# Patient Record
Sex: Male | Born: 1950 | Race: White | Hispanic: No | Smoking: Former smoker
Health system: Southern US, Community
[De-identification: ages and names within clinical notes are randomized; demographics above are authoritative.]

## PROBLEM LIST (undated history)

## (undated) DIAGNOSIS — Z8489 Family history of other specified conditions: Secondary | ICD-10-CM

## (undated) DIAGNOSIS — K219 Gastro-esophageal reflux disease without esophagitis: Secondary | ICD-10-CM

## (undated) DIAGNOSIS — IMO0001 Reserved for inherently not codable concepts without codable children: Secondary | ICD-10-CM

## (undated) DIAGNOSIS — F419 Anxiety disorder, unspecified: Secondary | ICD-10-CM

## (undated) DIAGNOSIS — J449 Chronic obstructive pulmonary disease, unspecified: Secondary | ICD-10-CM

## (undated) HISTORY — PX: TONSILLECTOMY: SUR1361

---

## 1978-11-16 HISTORY — PX: KNEE SURGERY: SHX244

## 2005-11-26 ENCOUNTER — Ambulatory Visit: Payer: Self-pay | Admitting: Gastroenterology

## 2010-05-27 ENCOUNTER — Encounter: Payer: Self-pay | Admitting: Pulmonary Disease

## 2010-05-27 ENCOUNTER — Ambulatory Visit (HOSPITAL_COMMUNITY): Admission: RE | Admit: 2010-05-27 | Discharge: 2010-05-27 | Payer: Self-pay | Admitting: Family Medicine

## 2010-06-23 ENCOUNTER — Ambulatory Visit: Payer: Self-pay | Admitting: Pulmonary Disease

## 2010-06-23 DIAGNOSIS — J309 Allergic rhinitis, unspecified: Secondary | ICD-10-CM | POA: Insufficient documentation

## 2010-06-24 ENCOUNTER — Telehealth (INDEPENDENT_AMBULATORY_CARE_PROVIDER_SITE_OTHER): Payer: Self-pay | Admitting: *Deleted

## 2010-08-07 ENCOUNTER — Telehealth (INDEPENDENT_AMBULATORY_CARE_PROVIDER_SITE_OTHER): Payer: Self-pay | Admitting: *Deleted

## 2010-08-13 ENCOUNTER — Telehealth (INDEPENDENT_AMBULATORY_CARE_PROVIDER_SITE_OTHER): Payer: Self-pay | Admitting: *Deleted

## 2010-09-22 ENCOUNTER — Ambulatory Visit: Payer: Self-pay | Admitting: Pulmonary Disease

## 2010-12-16 NOTE — Progress Notes (Signed)
Summary: results  of cxr  Phone Note Call from Patient   Caller: Patient Call For: [facility] Summary of Call: Ppr requests results of cxr. cell [phone] Initial call taken by: Tivis Ringer, CNA,  June 24, 2010 4:05 PM  Follow-up for Phone Call        called and spoke with pt.  pt aware of cxr results.  Aundra Millet Reynolds LPN  June 24, 2010 4:07 PM

## 2010-12-16 NOTE — Assessment & Plan Note (Signed)
Summary: consult for emphysema   Copy to:  Antony Haste Primary Provider/Referring Provider:  Antony Haste  CC:  Pulmonary Consult.  History of Present Illness: The pt is a 60y/o male who is referred for evaluation of dyspnea.  He has a longstanding h/o doe for the last 2-3 years, but has not returned to his usual baseline after an episode of what sounds like acute bronchitis this spring.  The pt has been trying to stay active, and goes to the gym for on an elliptical 2-3 times a week.  He denies getting sob bringing groceries in from the car, or with one flight of stairs.  He denies any cough issues, and has not had wheezing.  He has no known h/o heart disease, and denies any LE edema.  He denies any early FHx of emphysema.  He does have a long h/o tobacco abuse, but quit 3 years ago.  He has not had a recent cxr, but did [facility].  These showed severe obstructive disease.  Preventive Screening-Counseling & Management  Alcohol-Tobacco     Smoking Status: quit  Current Medications (verified): 1)  Gemfibrozil 600 Mg Tabs (Gemfibrozil) .... Take 1 Tablet By Mouth Once A Day 2)  Fish Oil Triple Strength 1400 Mg Caps (Omega-3 Fatty Acids) .... Take 1 Tablet By Mouth Once A Day 3)  Vitamin D 1000 Unit Tabs (Cholecalciferol) .... Take 1 Tablet By Mouth Once A Day 4)  Tylenol or Tylenol Extra Strength or Tylenol Pm .... Use As Needed  Allergies (verified): 1)  ! Penicillin  Past History:  Past Medical History: Current Problems:  ALLERGIC RHINITIS (ICD-477.9)    Past Surgical History: R knee surgery 1970s tonsillectomy as a child  Family History: Reviewed history and no changes required. emphysema: father allergies: mother cancer: father (lung), mother (colon)   Social History: Reviewed history and no changes required. Patient states former smoker.  started at age 52.  2 ppd.  quit August 2008. pt is separated. pt has children. pt lives with  his parents. pt works as a Veterinary surgeon.   Smoking Status:  quit  Review of Systems       The patient complains of shortness of breath with activity.  The patient denies shortness of breath at rest, productive cough, non-productive cough, coughing up blood, chest pain, irregular heartbeats, acid heartburn, indigestion, loss of appetite, weight change, abdominal pain, difficulty swallowing, sore throat, tooth/dental problems, headaches, nasal congestion/difficulty breathing through nose, sneezing, itching, ear ache, anxiety, depression, hand/feet swelling, joint stiffness or pain, rash, change in color of mucus, and fever.    Vital Signs:  Patient profile:   60 year old male Height:      75 inches Weight:      180 pounds BMI:     22.58 O2 Sat:      95 % on Room air Temp:     98.8 degrees F oral Pulse rate:   76 / minute BP sitting:   152 / 84  (right arm) Cuff size:   regular  Vitals Entered By: Arman Filter LPN (June 23, 2010 2:38 PM)  O2 Flow:  Room air CC: Pulmonary Consult Comments Medications reviewed with patient Arman Filter LPN  June 23, 2010 2:38 PM    Physical Exam  General:  thin male in nad Eyes:  PERRLA and EOMI.   Nose:  patent without discharge Mouth:  clear  Neck:  no jvd, tmg, LN Lungs:  clear  to auscultation Heart:  rrr, no mrg Abdomen:  soft and nontender, bs+ Extremities:  no edema or cyanosis noted pulses intact distally Neurologic:  alert and oriented, moves all 4.   Impression & Recommendations:  Problem # 1:  EMPHYSEMA (ICD-492.8) the pt has moderate to severe airflow obstruction by recent spirometry that is most c/w emphysema.  I cannot totally exclude a component of asthma, but his history is not supportive of this.  His functional status is much better than his degree of airflow obstruction, but he does not have day to day variability.  I would like to start him on a bronchodilator regimen, and will begin with longacting anticholinergic.   Will step up therapy from there if not happy with his progress.  Will also check cxr and A!AT level today as well for completeness.  He is to followup with me in 3mos, but call if not seeing improvement with spiriva.  I have stressed the importance of continuing on his exercise program.  Medications Added to Medication List This Visit: 1)  Gemfibrozil 600 Mg Tabs (Gemfibrozil) .... Take 1 tablet by mouth once a day 2)  Fish Oil Triple Strength 1400 Mg Caps (Omega-3 fatty acids) .... Take 1 tablet by mouth once a day 3)  Vitamin D 1000 Unit Tabs (Cholecalciferol) .... Take 1 tablet by mouth once a day 4)  Tylenol or Tylenol Extra Strength or Tylenol Pm  .... Use as needed 5)  Spiriva Handihaler 18 Mcg Caps (Tiotropium bromide monohydrate) .... One puff in handihaler daily 6)  Proair Hfa 108 (90 Base) Mcg/act Aers (Albuterol sulfate) .... 2 puffs every 4-6 hours as needed  Other Orders: Consultation Level IV (16109) T- * Misc. Laboratory test [phone]) T-2 View CXR (71020TC)  Patient Instructions: 1)  will start on spiriva one inhalation each am 2)  can use proventil (albuterol) 2 inhalations up to every 4 hrs for rescue. 3)  continue on exercise program 4)  will check cxr today, and will also check blood test for hereditary emphysema. 5)  followup with me in 3mos, but call if you are not satisfied with your response to spiriva.   Prescriptions: PROAIR HFA 108 (90 BASE) MCG/ACT  AERS (ALBUTEROL SULFATE) 2 puffs every 4-6 hours as needed  #1 x 6   Entered and Authorized by:   Barbaraann Share MD   Signed by:   Barbaraann Share MD on 06/23/2010   Method used:   Print then Give to Patient   RxID:   [id number] SPIRIVA HANDIHALER 18 MCG  CAPS (TIOTROPIUM BROMIDE MONOHYDRATE) one puff in handihaler daily  #30 x 6   Entered and Authorized by:   Barbaraann Share MD   Signed by:   Barbaraann Share MD on 06/23/2010   Method used:   Print then Give to Patient   RxID:   [id number]

## 2010-12-16 NOTE — Assessment & Plan Note (Signed)
Summary: rov for emphysema   Visit Type:  Follow-up Copy to:  Antony Haste Primary Provider/Referring Provider:  Antony Haste  CC:  3 month follow up. pt states breathing is doing well. Pt denies any cough. pt states xopenex caused him to have throat irritation and high voice. pt prefers to use proair over proventile. pt quit smoking 2009. Marland Kitchen  History of Present Illness: the pt comes in today for f/u of his known emphysema.  He is doing well with spiriva and as needed albuterol, and has been staying active.  He denies any recent pulmonary infection or acute exacerbation.  He denies any chest congestion, cough, or purulence.  Current Medications (verified): 1)  Gemfibrozil 600 Mg Tabs (Gemfibrozil) .... Take 1 Tablet By Mouth Once A Day 2)  Fish Oil Triple Strength 1400 Mg Caps (Omega-3 Fatty Acids) .... Take 1 Tablet By Mouth Once A Day 3)  Vitamin D 1000 Unit Tabs (Cholecalciferol) .... Take 1 Tablet By Mouth Once A Day 4)  Tylenol or Tylenol Extra Strength or Tylenol Pm .... Use As Needed 5)  Spiriva Handihaler 18 Mcg  Caps (Tiotropium Bromide Monohydrate) .... One Puff in Handihaler Daily 6)  Proventil Hfa 108 (90 Base) Mcg/act Aers (Albuterol Sulfate) .Marland Kitchen.. 1-2 Puffs Every 4-6 Hrs As Needed  Allergies (verified): 1)  ! Penicillin  Review of Systems       The patient complains of shortness of breath with activity.  The patient denies shortness of breath at rest, productive cough, non-productive cough, coughing up blood, chest pain, irregular heartbeats, acid heartburn, indigestion, loss of appetite, weight change, abdominal pain, difficulty swallowing, sore throat, tooth/dental problems, headaches, nasal congestion/difficulty breathing through nose, sneezing, itching, ear ache, anxiety, depression, hand/feet swelling, joint stiffness or pain, rash, change in color of mucus, and fever.    Vital Signs:  Patient profile:   60 year old male Height:      75 inches Weight:      183.50  pounds O2 Sat:      98 % on Room air Temp:     98.2 degrees F oral Pulse rate:   69 / minute BP sitting:   124 / 82  (left arm) Cuff size:   regular  Vitals Entered By: Carver Fila (September 22, 2010 1:38 PM)  O2 Flow:  Room air CC: 3 month follow up. pt states breathing is doing well. Pt denies any cough. pt states xopenex caused him to have throat irritation and high voice. pt prefers to use proair over proventile. pt quit smoking 2009.  Comments meds and allergies updated Phone number updated Carver Fila  September 22, 2010 1:38 PM    Physical Exam  General:  thin male in nad Lungs:  totally clear to auscultation Heart:  rrr, no mrg Extremities:  no edema or cyanosis  Neurologic:  alert and oriented, moves all 4.   Impression & Recommendations:  Problem # 1:  EMPHYSEMA (ICD-492.8) the pt is doing well with spiriva and as needed SABA, which he hardly uses.  He is staying active, and is satisfied with his exertional tolerance.  I have asked him to stay on his current meds, and to f/u in 6mos.  Medications Added to Medication List This Visit: 1)  Proventil Hfa 108 (90 Base) Mcg/act Aers (Albuterol sulfate) .Marland Kitchen.. 1-2 puffs every 4-6 hrs as needed 2)  Proair Hfa 108 (90 Base) Mcg/act Aers (Albuterol sulfate) .... 2 puffs every 4-6 hours as needed  Patient Instructions: 1)  will send in [facility]air to [facility] 2)  continue to stay active 3)  followup with me in 6mos.  Prescriptions: PROAIR HFA 108 (90 BASE) MCG/ACT  AERS (ALBUTEROL SULFATE) 2 puffs every 4-6 hours as needed  #3 x 4   Entered and Authorized by:   Barbaraann Share MD   Signed by:   Barbaraann Share MD on 09/22/2010   Method used:   Faxed to ...       [facility] (mail-order)             , [place]         Ph: [phone]       Fax: [phone]   RxID:   [id number]     Immunization History:  Influenza Immunization History:    Influenza:  historical (08/17/2010)  Pneumovax Immunization History:    Pneumovax:   historical (08/17/2010)

## 2010-12-16 NOTE — Progress Notes (Signed)
Summary: question about spiriva refill to [facility]  Phone Note Call from Patient Call back at Work Phone [phone] Call back at [phone]   Caller: Patient Call For: clance Summary of Call: pt needs to switch rx over to [facility] this is on his sprivia [phone]  Initial call taken by: Lacinda Axon,  August 13, 2010 12:24 PM  Follow-up for Phone Call        called and spoke with pt.  pt states he still hasn't gotten meds from [facility] that we faxed on Friday 08/08/2010 and states he is now about to run out.  Informed pt I would leave 1 box of Spiriva samples at the front desk for him to take until his shipment arrived.  Called [facility] and verified that they did indeed receive our fax on 08/08/2010 for rx for Spiriva and med was shipped out  to pt yesterday so he should be receiving it soon.  Informed pt of the above info. nothing further needed.   Aundra Millet Reynolds LPN  August 13, 2010 2:19 PM

## 2010-12-16 NOTE — Progress Notes (Signed)
Summary: proair changed to xopenex mdi  Phone Note Call from Patient Call back at [phone]   Caller: Patient Call For: clance Reason for Call: Refill Medication Summary of Call: Wants generic proair hfa 108//[facility] Initial call taken by: Darletta Moll,  August 07, 2010 4:28 PM  Follow-up for Phone Call        Spoke with pt.  He states he was told by [facility] that levalbuterol spray/inhalation would be cheaper for him instead of proair.  Would like to know if this med would work for him.  Dr. Shelle Iron, pls advise.  Thanks!  Follow-up by: Gweneth Dimitri RN,  August 07, 2010 5:01 PM  Additional Follow-up for Phone Call Additional follow up Details #1::        that would surprise me if that were the case. but ok to call in xopenex mdi 2 puffs every 6hrs as needed for rescue.  #1, 69fills Additional Follow-up by: Barbaraann Share MD,  August 07, 2010 5:03 PM    Additional Follow-up for Phone Call Additional follow up Details #2::    Called, spoke with pt.  He is aware Dr. Shelle Iron is ok with proair being changed to xopenex 2 puffs every 6 hours as needed for rescue.  He verbalized understanding and is aware rx sent to [facility].   Because this is being sent to [facility], will send rx as #3 with 1 additional fill.   Follow-up by: Gweneth Dimitri RN,  August 07, 2010 5:11 PM  New/Updated Medications: XOPENEX HFA 45 MCG/ACT AERO (LEVALBUTEROL TARTRATE) 2 puffs every 6 hours as needed for rescue Prescriptions: XOPENEX HFA 45 MCG/ACT AERO (LEVALBUTEROL TARTRATE) 2 puffs every 6 hours as needed for rescue  #3 x 1   Entered by:   Gweneth Dimitri RN   Authorized by:   Barbaraann Share MD   Signed by:   Gweneth Dimitri RN on 08/07/2010   Method used:   Faxed to ...       [facility] (mail-order)             , [place]         Ph: [phone]       Fax: [phone]   RxID:   [id number]

## 2011-03-19 ENCOUNTER — Encounter: Payer: Self-pay | Admitting: Pulmonary Disease

## 2011-03-23 ENCOUNTER — Ambulatory Visit (INDEPENDENT_AMBULATORY_CARE_PROVIDER_SITE_OTHER): Payer: BC Managed Care – PPO | Admitting: Pulmonary Disease

## 2011-03-23 ENCOUNTER — Encounter: Payer: Self-pay | Admitting: Pulmonary Disease

## 2011-03-23 VITALS — BP 140/76 | HR 69 | Temp 98.2°F | Ht 75.0 in | Wt 181.6 lb

## 2011-03-23 DIAGNOSIS — J438 Other emphysema: Secondary | ICD-10-CM

## 2011-03-23 NOTE — Patient Instructions (Signed)
No change in meds for now.  Can consider a long acting "proair" if you feels symptoms are not adequately controlled.  Work on Product manager followup with me in 6mos

## 2011-03-23 NOTE — Progress Notes (Signed)
  Subjective:    Patient ID: Dale Woods, male    DOB: [birth year], 60 y.o.   MRN: [id number]  HPI The pt comes in today for f/u of his known emphysema.  He has been staying on spiriva with prn albuterol, and feels he is doing well.  He is staying active, and although he does have doe, he feels he is remaining very functional.  He has not had an acute exacerbation or recent pulmonary infection, and uses his rescue inhaler about twice a week.    Review of Systems  Constitutional: Negative for fever and unexpected weight change.  HENT: Positive for congestion and rhinorrhea. Negative for ear pain, nosebleeds, sore throat, sneezing, trouble swallowing, dental problem, postnasal drip and sinus pressure.   Eyes: Negative for redness and itching.  Respiratory: Positive for shortness of breath and wheezing. Negative for cough and chest tightness.   Cardiovascular: Negative for palpitations and leg swelling.  Gastrointestinal: Negative for nausea and vomiting.  Genitourinary: Negative for dysuria.  Musculoskeletal: Negative for joint swelling.  Skin: Negative for rash.  Neurological: Positive for headaches.  Hematological: Does not bruise/bleed easily.  Psychiatric/Behavioral: Negative for dysphoric mood. The patient is not nervous/anxious.        Objective:   Physical Exam Wd male in nad Chest totally clear to auscultation, no wheezing Cor with rrr No LE edema, no cyanosis Alert, oriented, moves all 4        Assessment & Plan:

## 2011-03-23 NOTE — Assessment & Plan Note (Signed)
The pt is stable from a pulmonary standpoint.  He is staying active, and has not had any worsening symptoms.  I discussed with him today the possibility of adding a LABA to his regimen, but he would like to hold off for now.  He will f/u with me in 6mos

## 2011-06-09 ENCOUNTER — Other Ambulatory Visit: Payer: Self-pay | Admitting: Pulmonary Disease

## 2011-09-23 ENCOUNTER — Encounter: Payer: Self-pay | Admitting: Pulmonary Disease

## 2011-09-23 ENCOUNTER — Ambulatory Visit (INDEPENDENT_AMBULATORY_CARE_PROVIDER_SITE_OTHER): Payer: BC Managed Care – PPO | Admitting: Pulmonary Disease

## 2011-09-23 VITALS — BP 122/72 | HR 72 | Temp 98.6°F | Ht 75.0 in | Wt 181.8 lb

## 2011-09-23 DIAGNOSIS — J438 Other emphysema: Secondary | ICD-10-CM

## 2011-09-23 DIAGNOSIS — Z23 Encounter for immunization: Secondary | ICD-10-CM

## 2011-09-23 NOTE — Assessment & Plan Note (Signed)
The patient is doing very well on his current medical regimen.  He is trying to exercise as much as possible, and he has not had any recent pulmonary infection or acute exacerbation.  I have asked him to stay on his current bronchodilator, but if he does begin to have issues would consider adding LABA.  We'll give the patient a flu shot today, and he is to followup with me in one year.

## 2011-09-23 NOTE — Progress Notes (Signed)
  Subjective:    Patient ID: Dale Woods, male    DOB: 1951-05-22, 60 y.o.   MRN: [id number]  HPI The patient comes in today for followup of his known COPD.  He is maintaining on his bronchodilator regimen, and continues to have excellent exercise tolerance.  He has not had an acute exacerbation since his last visit, and is not over using his rescue inhaler.  He has not had his flu shot yet this year.   Review of Systems  Constitutional: Negative for fever and unexpected weight change.  HENT: Positive for congestion and rhinorrhea. Negative for ear pain, nosebleeds, sore throat, sneezing, trouble swallowing, dental problem, postnasal drip and sinus pressure.   Eyes: Negative for redness and itching.  Respiratory: Positive for wheezing. Negative for cough, chest tightness and shortness of breath.   Cardiovascular: Negative for palpitations and leg swelling.  Gastrointestinal: Negative for nausea and vomiting.  Genitourinary: Negative for dysuria.  Musculoskeletal: Negative for joint swelling.  Skin: Negative for rash.  Neurological: Negative for headaches.  Hematological: Does not bruise/bleed easily.  Psychiatric/Behavioral: Negative for dysphoric mood. The patient is not nervous/anxious.        Objective:   Physical Exam Thin male in no acute distress Nose without purulence or discharge noted Chest totally clear to auscultation, no wheezes or rhonchi Cardiac exam with regular rate and rhythm Lower extremities without edema, no cyanosis noted Alert and oriented, moves all 4 extremities.       Assessment & Plan:

## 2011-09-23 NOTE — Patient Instructions (Signed)
No change in medications Will give you the flu shot today followup with me in one year since you are doing so well, but call if having issues.

## 2011-09-23 NOTE — Progress Notes (Signed)
Addended by: Ozella Almond R on: 09/23/2011 02:14 PM   Modules accepted: Orders

## 2012-05-10 ENCOUNTER — Other Ambulatory Visit: Payer: Self-pay | Admitting: Neurosurgery

## 2012-05-20 ENCOUNTER — Telehealth: Payer: Self-pay | Admitting: Pulmonary Disease

## 2012-05-20 NOTE — Telephone Encounter (Signed)
Called, spoke with pt.  I informed him of below per Dr. Shelle Iron.  He verbalized understanding of this.  [facility] does not have an opening until Aug that I see that we can use.  He has a 4pm held slot on Aug 5 but pt would like to be seen prior to this if possible as preop is scheduled on Aug 7.  Pls advise if he can be worked in in July.  Thank you.

## 2012-05-20 NOTE — Telephone Encounter (Signed)
Because of the severity of his copd, he needs an ov prior to his surgery so I can check on him.

## 2012-05-20 NOTE — Telephone Encounter (Signed)
I spoke with pt and he states he is scheduled to have back surgery on 06/30/12 by Dr. Venetia Maxon over at [facility]. His pre-op apt is scheduled for 06/22/12. He was not told yet if he needed surgical clearance from [facility] yet or not but he wanted to get a jump on things just in case. Pt last seen 09/23/11 by [facility]. Please advise Dr. Shelle Iron, thanks

## 2012-05-20 NOTE — Telephone Encounter (Signed)
Pt can be seen on 06/02/12 @ 2:30pm.

## 2012-05-23 NOTE — Telephone Encounter (Signed)
Called spoke with patient, advised of work-in appt.  Pt okay with this date and time.  appt scheduled.  Nothing further needed at this time, will sign off.

## 2012-06-02 ENCOUNTER — Ambulatory Visit (INDEPENDENT_AMBULATORY_CARE_PROVIDER_SITE_OTHER): Payer: BC Managed Care – PPO | Admitting: Pulmonary Disease

## 2012-06-02 ENCOUNTER — Encounter: Payer: Self-pay | Admitting: Pulmonary Disease

## 2012-06-02 VITALS — BP 124/82 | HR 78 | Temp 98.6°F | Ht 74.0 in | Wt 185.6 lb

## 2012-06-02 DIAGNOSIS — J438 Other emphysema: Secondary | ICD-10-CM

## 2012-06-02 NOTE — Progress Notes (Signed)
  Subjective:    Patient ID: Dale Woods, male    DOB: 01-20-1951, 61 y.o.   MRN: [id number]  HPI The patient comes in today for followup of his known COPD, and also preop pulmonary clearance for his upcoming back surgery.  He has had no issues since his last visit, and feels that his breathing is very stable his current medication.  He is very active, and has not had an acute exacerbation of her pulmonary infection since last visit.  He feels that he is at his normal baseline.   Review of Systems  Constitutional: Positive for fatigue. Negative for fever and unexpected weight change.  HENT: Negative for ear pain, nosebleeds, congestion, sore throat, rhinorrhea, sneezing, trouble swallowing, dental problem, postnasal drip and sinus pressure.   Eyes: Negative for redness and itching.  Respiratory: Positive for shortness of breath. Negative for cough, chest tightness and wheezing.   Cardiovascular: Negative for palpitations and leg swelling.  Gastrointestinal: Negative for nausea and vomiting.  Genitourinary: Negative for dysuria.  Musculoskeletal: Positive for back pain. Negative for joint swelling.  Skin: Negative for rash.  Neurological: Negative for headaches.  Hematological: Does not bruise/bleed easily.  Psychiatric/Behavioral: Negative for dysphoric mood. The patient is not nervous/anxious.   All other systems reviewed and are negative.       Objective:   Physical Exam Thin male in no acute distress Nose without purulence or discharge noted Chest totally clear to auscultation, no wheezing Cardiac exam is regular rate and rhythm Lower extremities without edema, cyanosis Alert and oriented, moves all 4 extremities.       Assessment & Plan:

## 2012-06-02 NOTE — Assessment & Plan Note (Signed)
The patient is doing very well from a COPD standpoint, with excellent exertional tolerance and no recent complications.  I think he will do very well from a pulmonary standpoint with his upcoming back surgery, and feel that he is at low risk for postop pulmonary complications.  I would recommend early ambulation when allowed by neurosurgery, as well as early incentive spirometry.  Otherwise, I would continue his usual home pulmonary medications while in the hospital.

## 2012-06-02 NOTE — Patient Instructions (Addendum)
Continue with current medications Use incentive spirometry frequently after surgery to help keep lungs expanded. Cancel followup visit in Nov, and schedule followup with me in one year.

## 2012-06-11 ENCOUNTER — Other Ambulatory Visit: Payer: Self-pay | Admitting: Pulmonary Disease

## 2012-06-15 ENCOUNTER — Encounter (HOSPITAL_COMMUNITY): Payer: Self-pay | Admitting: Pharmacy Technician

## 2012-06-22 ENCOUNTER — Encounter (HOSPITAL_COMMUNITY): Payer: Self-pay

## 2012-06-22 ENCOUNTER — Encounter (HOSPITAL_COMMUNITY)
Admission: RE | Admit: 2012-06-22 | Discharge: 2012-06-22 | Disposition: A | Discharge status: Home / Self Care | Payer: BC Managed Care – PPO | Source: Ambulatory Visit | Attending: Neurosurgery | Admitting: Neurosurgery

## 2012-06-22 HISTORY — DX: Gastro-esophageal reflux disease without esophagitis: K21.9

## 2012-06-22 HISTORY — DX: Chronic obstructive pulmonary disease, unspecified: J44.9

## 2012-06-22 LAB — BASIC METABOLIC PANEL
CO2: 27 mEq/L (ref 19–32)
Calcium: 9.4 mg/dL (ref 8.4–10.5)
Creatinine, Ser: 1.19 mg/dL (ref 0.50–1.35)
GFR calc Af Amer: 76 mL/min — ABNORMAL LOW (ref >90)
Sodium: 138 mEq/L (ref 135–145)

## 2012-06-22 LAB — CBC
MCH: 31.1 pg (ref 26.0–34.0)
Platelets: 256 10*3/uL (ref 150–400)
RBC: 4.76 MIL/uL (ref 4.22–5.81)

## 2012-06-22 LAB — SURGICAL PCR SCREEN
MRSA, PCR: NEGATIVE
Staphylococcus aureus: NEGATIVE

## 2012-06-22 NOTE — Pre-Procedure Instructions (Signed)
20 JEDI CATALFAMO  06/22/2012   Your procedure is scheduled on:  06/28/12  Report to [facility] at 530 AM.  Call this number if you have problems the morning of surgery: [phone]   Remember:   Do not eat food:After Midnight.  May have clear liquids:until Midnight .  Clear liquids include soda, tea, black coffee, apple or grape juice, broth.  Take these medicines the morning of surgery with A SIP OF WATER: *inhalers   Do not wear jewelry, make-up or nail polish.  Do not wear lotions, powders, or perfumes. You may wear deodorant.  Do not shave 48 hours prior to surgery. Men may shave face and neck.  Do not bring valuables to the hospital.  Contacts, dentures or bridgework may not be worn into surgery.  Leave suitcase in the car. After surgery it may be brought to your room.  For patients admitted to the hospital, checkout time is 11:00 AM the day of discharge.   Patients discharged the day of surgery will not be allowed to drive home.  Name and phone number of your driver: family  Special Instructions: CHG Shower Use Special Wash: 1/2 bottle night before surgery and 1/2 bottle morning of surgery.   Please read over the following fact sheets that you were given: Pain Booklet, Coughing and Deep Breathing, Blood Transfusion Information, MRSA Information and Surgical Site Infection Prevention

## 2012-06-22 NOTE — Progress Notes (Signed)
ok'ed for surgery by dr clance. Note in epic

## 2012-06-27 MED ORDER — VANCOMYCIN HCL IN DEXTROSE 1-5 GM/200ML-% IV SOLN
1000.0000 mg | INTRAVENOUS | Status: AC
  Administered 2012-06-28: 1000 mg via INTRAVENOUS
  Filled 2012-06-27: qty 200

## 2012-06-27 NOTE — H&P (Signed)
[name]  #[id number]  DOB:  Sep 20, 1951 05/09/2012:  [name] comes back today.  He had an injection.  He did not get any significant relief with that injection and said it helped only one week.  He says his pain is worse.  Both his legs are going numb.  He has significant pain in his left buttock and low back tightness and tired "all of the time".  He has been taking Naproxen twice daily.  He is not getting a lot of relief.    I went over his studies with him and his fiancee and we reviewed his imaging both radiographs and MRI.  These show significant scoliosis and spondylosis with stenosis at L3-4 on the right and L4-5 on the left.    I have recommended an anterolateral decompression and fusion L3-4 and L4-5 levels from a right-sided approach with percutaneous pedicle screw fixation.  I fitted him for an LSO brace.  He wants to go ahead with surgery early to mid August.  We went over details of the surgery.  Dale Woods also did patient education, fitted him for an LSO brace, answered their questions, reviewed models and imaging and discussed risks and benefits and he wishes to proceed with surgery.          Dale Woods. 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  He complains of numbness in both of his thighs to his feet.  He says this has been increasing since 12/2010.  The numbness has progressed since 01/2012.  He was taking Aleve, but stopped it waiting in preparation for an injection.  He had an injection on 03/11/2012 on the left at L4-5 which he said helped for about 3 days.  He has another injection scheduled for this Friday and is hoping to temporize his discomfort as his daughter is getting married very soon.  He describes it as an uncomfortable nagging pain for the last 2 to 3  months.  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  He did physical therapy and they did not think there was much they could teach him.  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    REVIEW OF SYSTEMS:   Review of Systems was reviewed with the patient.  Pertinent positives include under Eyes - he wears glasses; Ear, Nose, Mouth and Throat - he notes ringing in his ears; Cardiovascular - he notes elevated cholesterol; Respiratory - he notes emphysema and shortness of breath; Musculoskeletal - he notes back pain and leg pain.  He does note a history of smoking.    PAST MEDICAL HISTORY:      Medications and Allergies:  He takes Spiriva daily, Gemfibrozil 600 mg daily, Vitamin D and Fish Oil.  HE IS ALLERGIC TO PENICILLIN WHICH CAUSES ANAPHYLACTIC SHOCK.      Height and Weight:  He is 6', 2" tall and 178 lbs. with a BMI of 22.9.    SOCIAL HISTORY:    He denies tobacco, alcohol or drug use.  He stopped smoking 5 years ago.    DIAGNOSTIC STUDIES:   I reviewed an MRI of the lumbar spine which demonstrates a disc protrusion at L4-5 on  the left and central stenosis greater to the right at the L3-4 level.  MRI was performed through [facility].    PHYSICAL EXAMINATION:      General Appearance:  On examination today, [name] is a pleasant, cooperative man in no acute distress.      Blood Pressure, Pulse and Respiratory Rate:  His blood pressure is 132/82.  Heart rate is 70 and regular.  Respiratory rate is 16.      HEENT - normocephalic, atraumatic.  The pupils are equal, round and reactive to light.  The extraocular muscles are intact.  Sclerae - white.  Conjunctiva - pink.  Oropharynx benign.  Uvula midline.     Neck - there are no masses, meningismus, deformities, tracheal deviation, jugular vein distention or carotid bruits.  There is normal cervical range of motion.  Spurlings' test is negative  without reproducible radicular pain turning the patient's head to either side.  Lhermitte's sign is not present with axial compression.      Respiratory - there is normal respiratory effort with good intercostal function.  Lungs are clear to auscultation.  There are no rales, rhonchi or wheezes.      Cardiovascular - the heart has regular rate and rhythm to auscultation.  No murmurs are appreciated.  There is no extremity edema, cyanosis or clubbing.  There are palpable pedal pulses.      Abdomen - soft, nontender, no hepatosplenomegaly appreciated or masses.  There are active bowel sounds.  No guarding or rebound.      Musculoskeletal Examination - he complains of pain at the lumbosacral junction.  He is able to stand on his heels and toes.  He has a positive straight leg raise on the left at 45 degrees.    NEUROLOGICAL EXAMINATION: The patient is oriented to time, person and place and has good recall of both recent and remote memory with normal attention span and concentration.  The patient speaks with clear and fluent speech and exhibits normal language function and appropriate fund of knowledge.      Cranial Nerve Examination - pupils are equal, round and reactive to light.  Extraocular movements are full.  Visual fields are full to confrontational testing.  Facial sensation and facial movement are symmetric and intact.  Hearing is intact to finger rub.  Palate is upgoing.  Shoulder shrug is symmetric.  Tongue protrudes in the midline.    Motor Examination - motor strength is 5/5 in the bilateral deltoids, biceps, triceps, handgrips, wrist extensors, interosseous.  In the lower extremities motor strength is 5/5 with the exception of 4+/5 left EHL strength and 4+/5 left hip abductor strength.      Sensory Examination - he notes decreased pin sensation in a left L5 and S1 distribution.      Deep Tendon Reflexes - 2 in the biceps, triceps and brachioradialis, 2 at the knees, 2 at the ankles and  great toes are downgoing to plantar stimulation.      Cerebellar Examination - normal coordination in upper and lower extremities and normal rapid alternating movements.  Romberg test is negative.    IMPRESSION AND RECOMMENDATIONS:   Dale Woods is a 61 year old man with lumbar stenosis and spondylosis causing significant stenosis at L3-4 and left-sided disc protrusion at L4-5 causing left lumbar radiculopathy.  He has mild weakness in a L5 distribution.  He got limited benefit from a nerve block.  He is scheduled for another one later this week.    My initial  discussion with the patient was regarding his stenosis and that he might be a candidate for lumbar laminectomy for stenosis.  However, on my review of his lumbar radiographs with him, there is a significant degree of scoliosis with levoconvex scoliosis with the apex of the curvature at the L3-4 level and I believe that this is a significant factor in his presentation.  I expressed concern to him that if he were to undergo laminectomy that he would run the risk of destabilization of his spine needing further surgery and that if he does need to undergo surgery, a minimally invasive decompression and fusion using anterolateral technique would be a reasonable option.  He is going to see how he does with the injections.  He will come back to see me in one month for reassessment.    [facility]    Dale Woods. Dale Woods, M.D.

## 2012-06-28 ENCOUNTER — Ambulatory Visit (HOSPITAL_COMMUNITY): Payer: BC Managed Care – PPO

## 2012-06-28 ENCOUNTER — Ambulatory Visit (HOSPITAL_COMMUNITY): Payer: BC Managed Care – PPO | Admitting: Anesthesiology

## 2012-06-28 ENCOUNTER — Inpatient Hospital Stay (HOSPITAL_COMMUNITY)
Admission: RE | Admit: 2012-06-28 | Discharge: 2012-07-01 | DRG: 755 | Disposition: A | Discharge status: Home / Self Care | Payer: BC Managed Care – PPO | Source: Ambulatory Visit | Attending: Neurosurgery | Admitting: Neurosurgery

## 2012-06-28 ENCOUNTER — Encounter (HOSPITAL_COMMUNITY): Payer: Self-pay | Admitting: Surgery

## 2012-06-28 ENCOUNTER — Encounter (HOSPITAL_COMMUNITY)
Admission: RE | Disposition: A | Discharge status: Home / Self Care | Payer: Self-pay | Source: Ambulatory Visit | Attending: Neurosurgery

## 2012-06-28 ENCOUNTER — Encounter (HOSPITAL_COMMUNITY): Payer: Self-pay | Admitting: Anesthesiology

## 2012-06-28 DIAGNOSIS — J449 Chronic obstructive pulmonary disease, unspecified: Secondary | ICD-10-CM | POA: Diagnosis present

## 2012-06-28 DIAGNOSIS — M47817 Spondylosis without myelopathy or radiculopathy, lumbosacral region: Principal | ICD-10-CM | POA: Diagnosis present

## 2012-06-28 DIAGNOSIS — Z79899 Other long term (current) drug therapy: Secondary | ICD-10-CM

## 2012-06-28 DIAGNOSIS — J4489 Other specified chronic obstructive pulmonary disease: Secondary | ICD-10-CM | POA: Diagnosis present

## 2012-06-28 DIAGNOSIS — M4120 Other idiopathic scoliosis, site unspecified: Secondary | ICD-10-CM | POA: Diagnosis present

## 2012-06-28 DIAGNOSIS — Z01812 Encounter for preprocedural laboratory examination: Secondary | ICD-10-CM

## 2012-06-28 HISTORY — PX: ANTERIOR LAT LUMBAR FUSION: SHX1168

## 2012-06-28 SURGERY — ANTERIOR LATERAL LUMBAR FUSION 2 LEVELS
Anesthesia: General | Site: Spine Lumbar | Laterality: Right | Wound class: Clean

## 2012-06-28 MED ORDER — BUPIVACAINE HCL (PF) 0.5 % IJ SOLN
INTRAMUSCULAR | Status: DC | PRN
  Administered 2012-06-28: 5 mL
  Administered 2012-06-28: 2.5 mL
  Administered 2012-06-28: 21 mL

## 2012-06-28 MED ORDER — KCL IN DEXTROSE-NACL 20-5-0.45 MEQ/L-%-% IV SOLN
INTRAVENOUS | Status: DC
  Administered 2012-06-28 – 2012-06-29 (×4): via INTRAVENOUS
  Filled 2012-06-28 (×7): qty 1000

## 2012-06-28 MED ORDER — GLYCOPYRROLATE 0.2 MG/ML IJ SOLN
INTRAMUSCULAR | Status: DC | PRN
  Administered 2012-06-28: 0.6 mg via INTRAVENOUS

## 2012-06-28 MED ORDER — DIPHENHYDRAMINE HCL 50 MG/ML IJ SOLN
12.5000 mg | Freq: Four times a day (QID) | INTRAMUSCULAR | Status: DC | PRN
  Administered 2012-06-28: 12.5 mg via INTRAVENOUS
  Filled 2012-06-28: qty 1

## 2012-06-28 MED ORDER — CYCLOBENZAPRINE HCL 10 MG PO TABS
10.0000 mg | ORAL_TABLET | Freq: Three times a day (TID) | ORAL | Status: DC | PRN
  Administered 2012-06-28 – 2012-07-01 (×4): 10 mg via ORAL
  Filled 2012-06-28 (×4): qty 1

## 2012-06-28 MED ORDER — SENNOSIDES-DOCUSATE SODIUM 8.6-50 MG PO TABS
1.0000 | ORAL_TABLET | Freq: Every evening | ORAL | Status: DC | PRN
  Administered 2012-06-28: 1 via ORAL

## 2012-06-28 MED ORDER — ACETAMINOPHEN 650 MG RE SUPP: 650.0000 mg | RECTAL | Status: DC | PRN

## 2012-06-28 MED ORDER — HYDROMORPHONE HCL PF 1 MG/ML IJ SOLN: 0.2500 mg | INTRAMUSCULAR | Status: DC | PRN

## 2012-06-28 MED ORDER — LACTATED RINGERS IV SOLN
INTRAVENOUS | Status: DC | PRN
  Administered 2012-06-28 (×4): via INTRAVENOUS

## 2012-06-28 MED ORDER — EPHEDRINE SULFATE 50 MG/ML IJ SOLN
INTRAMUSCULAR | Status: DC | PRN
  Administered 2012-06-28: 10 mg via INTRAVENOUS

## 2012-06-28 MED ORDER — CIPROFLOXACIN HCL 500 MG PO TABS
500.0000 mg | ORAL_TABLET | Freq: Two times a day (BID) | ORAL | Status: DC
  Administered 2012-06-28 – 2012-07-01 (×6): 500 mg via ORAL
  Filled 2012-06-28 (×9): qty 1

## 2012-06-28 MED ORDER — LIDOCAINE HCL (CARDIAC) 20 MG/ML IV SOLN
INTRAVENOUS | Status: DC | PRN
  Administered 2012-06-28: 100 mg via INTRAVENOUS

## 2012-06-28 MED ORDER — ACETAMINOPHEN 325 MG PO TABS: 650.0000 mg | ORAL_TABLET | Freq: Four times a day (QID) | ORAL | Status: DC | PRN

## 2012-06-28 MED ORDER — NALOXONE HCL 0.4 MG/ML IJ SOLN: 0.4000 mg | INTRAMUSCULAR | Status: DC | PRN

## 2012-06-28 MED ORDER — MIDAZOLAM HCL 5 MG/5ML IJ SOLN
INTRAMUSCULAR | Status: DC | PRN
  Administered 2012-06-28: 2 mg via INTRAVENOUS

## 2012-06-28 MED ORDER — VITAMIN D 1000 UNITS PO CAPS: 1000.0000 [IU] | ORAL_CAPSULE | Freq: Every day | ORAL | Status: DC

## 2012-06-28 MED ORDER — DOCUSATE SODIUM 100 MG PO CAPS
100.0000 mg | ORAL_CAPSULE | Freq: Two times a day (BID) | ORAL | Status: DC
  Administered 2012-06-28 – 2012-07-01 (×6): 100 mg via ORAL
  Filled 2012-06-28 (×5): qty 1

## 2012-06-28 MED ORDER — NEOSTIGMINE METHYLSULFATE 1 MG/ML IJ SOLN
INTRAMUSCULAR | Status: DC | PRN
  Administered 2012-06-28: 4 mg via INTRAVENOUS

## 2012-06-28 MED ORDER — SUCCINYLCHOLINE CHLORIDE 20 MG/ML IJ SOLN
INTRAMUSCULAR | Status: DC | PRN
  Administered 2012-06-28: 140 mg via INTRAVENOUS

## 2012-06-28 MED ORDER — HYDROCODONE-ACETAMINOPHEN 5-325 MG PO TABS: 1.0000 | ORAL_TABLET | ORAL | Status: DC | PRN

## 2012-06-28 MED ORDER — LIDOCAINE-EPINEPHRINE 1 %-1:100000 IJ SOLN
INTRAMUSCULAR | Status: DC | PRN
  Administered 2012-06-28: 5 mL
  Administered 2012-06-28: 21 mL
  Administered 2012-06-28: 2.5 mL

## 2012-06-28 MED ORDER — SENNA 8.6 MG PO TABS
1.0000 | ORAL_TABLET | Freq: Two times a day (BID) | ORAL | Status: DC
  Administered 2012-06-28 – 2012-07-01 (×6): 8.6 mg via ORAL
  Filled 2012-06-28 (×8): qty 1

## 2012-06-28 MED ORDER — MORPHINE SULFATE (PF) 1 MG/ML IV SOLN
INTRAVENOUS | Status: AC
  Filled 2012-06-28: qty 25

## 2012-06-28 MED ORDER — OXYCODONE-ACETAMINOPHEN 5-325 MG PO TABS
1.0000 | ORAL_TABLET | ORAL | Status: DC | PRN
  Administered 2012-06-28 – 2012-07-01 (×7): 2 via ORAL
  Filled 2012-06-28 (×6): qty 2

## 2012-06-28 MED ORDER — VITAMIN D3 25 MCG (1000 UNIT) PO TABS
1000.0000 [IU] | ORAL_TABLET | Freq: Every day | ORAL | Status: DC
  Administered 2012-06-29 – 2012-07-01 (×3): 1000 [IU] via ORAL
  Filled 2012-06-28 (×3): qty 1

## 2012-06-28 MED ORDER — PROPOFOL 10 MG/ML IV EMUL
INTRAVENOUS | Status: DC | PRN
  Administered 2012-06-28: 200 mg via INTRAVENOUS

## 2012-06-28 MED ORDER — TIOTROPIUM BROMIDE MONOHYDRATE 18 MCG IN CAPS
18.0000 ug | ORAL_CAPSULE | Freq: Every day | RESPIRATORY_TRACT | Status: DC
  Administered 2012-06-29 – 2012-07-01 (×3): 18 ug via RESPIRATORY_TRACT
  Filled 2012-06-28: qty 5

## 2012-06-28 MED ORDER — ONDANSETRON HCL 4 MG/2ML IJ SOLN: 4.0000 mg | Freq: Four times a day (QID) | INTRAMUSCULAR | Status: DC | PRN

## 2012-06-28 MED ORDER — ALBUTEROL SULFATE HFA 108 (90 BASE) MCG/ACT IN AERS: 1.0000 | INHALATION_SPRAY | Freq: Four times a day (QID) | RESPIRATORY_TRACT | Status: DC | PRN

## 2012-06-28 MED ORDER — VANCOMYCIN HCL IN DEXTROSE 1-5 GM/200ML-% IV SOLN
1000.0000 mg | Freq: Two times a day (BID) | INTRAVENOUS | Status: AC
  Administered 2012-06-28 – 2012-06-29 (×2): 1000 mg via INTRAVENOUS
  Filled 2012-06-28 (×3): qty 200

## 2012-06-28 MED ORDER — ACETAMINOPHEN 325 MG PO TABS: 650.0000 mg | ORAL_TABLET | ORAL | Status: DC | PRN

## 2012-06-28 MED ORDER — ALBUMIN HUMAN 5 % IV SOLN
INTRAVENOUS | Status: DC | PRN
  Administered 2012-06-28: 11:00:00 via INTRAVENOUS

## 2012-06-28 MED ORDER — MORPHINE SULFATE (PF) 1 MG/ML IV SOLN
INTRAVENOUS | Status: DC
Start: 2012-06-28 — End: 2012-06-30
  Administered 2012-06-28: 13:00:00 via INTRAVENOUS
  Administered 2012-06-28: 10.5 mg via INTRAVENOUS
  Administered 2012-06-28 – 2012-06-29 (×2): via INTRAVENOUS
  Administered 2012-06-29: 12 mL via INTRAVENOUS
  Administered 2012-06-29: 30.91 mg via INTRAVENOUS
  Administered 2012-06-29: 06:00:00 via INTRAVENOUS
  Administered 2012-06-29: 9.5 mg via INTRAVENOUS
  Administered 2012-06-29: 10.5 mg via INTRAVENOUS
  Administered 2012-06-29: 7.5 mg via INTRAVENOUS
  Administered 2012-06-30: 16.2 mg via INTRAVENOUS
  Administered 2012-06-30: 15 mg via INTRAVENOUS
  Administered 2012-06-30: 03:00:00 via INTRAVENOUS
  Administered 2012-06-30: 6 mg via INTRAVENOUS
  Filled 2012-06-28 (×6): qty 25

## 2012-06-28 MED ORDER — METRONIDAZOLE 500 MG PO TABS
500.0000 mg | ORAL_TABLET | Freq: Three times a day (TID) | ORAL | Status: DC
  Administered 2012-06-28 – 2012-07-01 (×8): 500 mg via ORAL
  Filled 2012-06-28 (×11): qty 1

## 2012-06-28 MED ORDER — SODIUM CHLORIDE 0.9 % IV SOLN: 250.0000 mL | INTRAVENOUS | Status: DC

## 2012-06-28 MED ORDER — SODIUM CHLORIDE 0.9 % IJ SOLN: 3.0000 mL | INTRAMUSCULAR | Status: DC | PRN

## 2012-06-28 MED ORDER — PHENOL 1.4 % MT LIQD: 1.0000 | OROMUCOSAL | Status: DC | PRN

## 2012-06-28 MED ORDER — OXYCODONE-ACETAMINOPHEN 5-325 MG PO TABS
ORAL_TABLET | ORAL | Status: AC
  Filled 2012-06-28: qty 2

## 2012-06-28 MED ORDER — ALUM & MAG HYDROXIDE-SIMETH 200-200-20 MG/5ML PO SUSP: 30.0000 mL | Freq: Four times a day (QID) | ORAL | Status: DC | PRN

## 2012-06-28 MED ORDER — KCL IN DEXTROSE-NACL 20-5-0.45 MEQ/L-%-% IV SOLN
INTRAVENOUS | Status: AC
  Filled 2012-06-28: qty 1000

## 2012-06-28 MED ORDER — DIPHENHYDRAMINE HCL 12.5 MG/5ML PO ELIX
12.5000 mg | ORAL_SOLUTION | Freq: Four times a day (QID) | ORAL | Status: DC | PRN
  Administered 2012-06-29: 12.5 mg via ORAL
  Filled 2012-06-28: qty 10

## 2012-06-28 MED ORDER — ROCURONIUM BROMIDE 100 MG/10ML IV SOLN
INTRAVENOUS | Status: DC | PRN
  Administered 2012-06-28: 10 mg via INTRAVENOUS
  Administered 2012-06-28: 30 mg via INTRAVENOUS

## 2012-06-28 MED ORDER — ONDANSETRON HCL 4 MG/2ML IJ SOLN: 4.0000 mg | Freq: Once | INTRAMUSCULAR | Status: DC | PRN

## 2012-06-28 MED ORDER — MENTHOL 3 MG MT LOZG: 1.0000 | LOZENGE | OROMUCOSAL | Status: DC | PRN

## 2012-06-28 MED ORDER — ONDANSETRON HCL 4 MG/2ML IJ SOLN
INTRAMUSCULAR | Status: DC | PRN
  Administered 2012-06-28: 4 mg via INTRAVENOUS

## 2012-06-28 MED ORDER — SODIUM CHLORIDE 0.9 % IR SOLN
Status: DC | PRN
  Administered 2012-06-28: 11:00:00

## 2012-06-28 MED ORDER — FENTANYL CITRATE 0.05 MG/ML IJ SOLN
INTRAMUSCULAR | Status: DC | PRN
  Administered 2012-06-28 (×3): 50 ug via INTRAVENOUS
  Administered 2012-06-28: 100 ug via INTRAVENOUS
  Administered 2012-06-28 (×5): 50 ug via INTRAVENOUS

## 2012-06-28 MED ORDER — SODIUM CHLORIDE 0.9 % IJ SOLN: 9.0000 mL | INTRAMUSCULAR | Status: DC | PRN

## 2012-06-28 MED ORDER — BISACODYL 10 MG RE SUPP: 10.0000 mg | Freq: Every day | RECTAL | Status: DC | PRN

## 2012-06-28 MED ORDER — BACITRACIN 50000 UNITS IM SOLR
INTRAMUSCULAR | Status: AC
  Filled 2012-06-28: qty 1

## 2012-06-28 MED ORDER — PANTOPRAZOLE SODIUM 40 MG IV SOLR
40.0000 mg | Freq: Every day | INTRAVENOUS | Status: DC
  Administered 2012-06-28 – 2012-06-30 (×3): 40 mg via INTRAVENOUS
  Filled 2012-06-28 (×4): qty 40

## 2012-06-28 MED ORDER — 0.9 % SODIUM CHLORIDE (POUR BTL) OPTIME
TOPICAL | Status: DC | PRN
  Administered 2012-06-28 (×2): 1000 mL

## 2012-06-28 MED ORDER — GEMFIBROZIL 600 MG PO TABS
600.0000 mg | ORAL_TABLET | Freq: Every day | ORAL | Status: DC
  Administered 2012-06-28 – 2012-06-30 (×3): 600 mg via ORAL
  Filled 2012-06-28 (×4): qty 1

## 2012-06-28 MED ORDER — SODIUM CHLORIDE 0.9 % IJ SOLN
3.0000 mL | Freq: Two times a day (BID) | INTRAMUSCULAR | Status: DC
  Administered 2012-06-28 – 2012-07-01 (×3): 3 mL via INTRAVENOUS

## 2012-06-28 MED ORDER — SODIUM CHLORIDE 0.9 % IV SOLN
INTRAVENOUS | Status: AC
  Filled 2012-06-28: qty 500

## 2012-06-28 MED ORDER — FLEET ENEMA 7-19 GM/118ML RE ENEM: 1.0000 | ENEMA | Freq: Once | RECTAL | Status: AC | PRN

## 2012-06-28 MED ORDER — ONDANSETRON HCL 4 MG/2ML IJ SOLN: 4.0000 mg | INTRAMUSCULAR | Status: DC | PRN

## 2012-06-28 MED ORDER — PROPOFOL 10 MG/ML IV EMUL: INTRAVENOUS | Status: DC | PRN

## 2012-06-28 SURGICAL SUPPLY — 75 items
BAG DECANTER FOR FLEXI CONT (MISCELLANEOUS) ×3 IMPLANT
BENZOIN TINCTURE PRP APPL 2/3 (GAUZE/BANDAGES/DRESSINGS) IMPLANT
BLADE SURG ROTATE 9660 (MISCELLANEOUS) IMPLANT
BONE VOID FILLER STRIP 10CC (Bone Implant) ×3 IMPLANT
CLOTH BEACON ORANGE TIMEOUT ST (SAFETY) ×3 IMPLANT
CONT SPEC 4OZ CLIKSEAL STRL BL (MISCELLANEOUS) IMPLANT
COROENT XL 12X22X55 (Orthopedic Implant) ×3 IMPLANT
COVER BACK TABLE 24X17X13 BIG (DRAPES) ×3 IMPLANT
COVER TABLE BACK 60X90 (DRAPES) ×3 IMPLANT
DERMABOND ADVANCED (GAUZE/BANDAGES/DRESSINGS) ×1
DERMABOND ADVANCED .7 DNX12 (GAUZE/BANDAGES/DRESSINGS) ×2 IMPLANT
DRAPE C-ARM 42X72 X-RAY (DRAPES) ×6 IMPLANT
DRAPE C-ARMOR (DRAPES) ×6 IMPLANT
DRAPE LAPAROTOMY 100X72X124 (DRAPES) ×6 IMPLANT
DRAPE POUCH INSTRU U-SHP 10X18 (DRAPES) ×6 IMPLANT
DRAPE SURG 17X23 STRL (DRAPES) ×6 IMPLANT
DRESSING TELFA 8X3 (GAUZE/BANDAGES/DRESSINGS) IMPLANT
DURAPREP 26ML APPLICATOR (WOUND CARE) ×6 IMPLANT
ELECT REM PT RETURN 9FT ADLT (ELECTROSURGICAL) ×6
ELECTRODE REM PT RTRN 9FT ADLT (ELECTROSURGICAL) ×4 IMPLANT
GAUZE SPONGE 4X4 16PLY XRAY LF (GAUZE/BANDAGES/DRESSINGS) ×3 IMPLANT
GLOVE BIO SURGEON STRL SZ8 (GLOVE) ×9 IMPLANT
GLOVE BIOGEL PI IND STRL 8 (GLOVE) ×6 IMPLANT
GLOVE BIOGEL PI IND STRL 8.5 (GLOVE) ×6 IMPLANT
GLOVE BIOGEL PI INDICATOR 8 (GLOVE) ×3
GLOVE BIOGEL PI INDICATOR 8.5 (GLOVE) ×3
GLOVE ECLIPSE 7.5 STRL STRAW (GLOVE) ×9 IMPLANT
GLOVE ECLIPSE 8.0 STRL XLNG CF (GLOVE) ×6 IMPLANT
GLOVE EXAM NITRILE LRG STRL (GLOVE) IMPLANT
GLOVE EXAM NITRILE MD LF STRL (GLOVE) IMPLANT
GLOVE EXAM NITRILE XL STR (GLOVE) IMPLANT
GLOVE EXAM NITRILE XS STR PU (GLOVE) IMPLANT
GOWN BRE IMP SLV AUR LG STRL (GOWN DISPOSABLE) ×3 IMPLANT
GOWN BRE IMP SLV AUR XL STRL (GOWN DISPOSABLE) ×6 IMPLANT
GOWN STRL REIN 2XL LVL4 (GOWN DISPOSABLE) ×12 IMPLANT
IMPLANT COROENT XLW 8X22X55 (Orthopedic Implant) ×3 IMPLANT
KIT BASIN OR (CUSTOM PROCEDURE TRAY) ×6 IMPLANT
KIT DILATOR XLIF 5 (KITS) ×2 IMPLANT
KIT INFUSE MEDIUM (Orthopedic Implant) ×3 IMPLANT
KIT MAXCESS (KITS) ×3 IMPLANT
KIT NEEDLE NVM5 EMG ELECT (KITS) ×2 IMPLANT
KIT NEEDLE NVM5 EMG ELECTRODE (KITS) ×1
KIT POSITION SURG JACKSON T1 (MISCELLANEOUS) IMPLANT
KIT ROOM TURNOVER OR (KITS) ×3 IMPLANT
KIT XLIF (KITS) ×1
MARKER SKIN DUAL TIP RULER LAB (MISCELLANEOUS) IMPLANT
NEEDLE HYPO 25X1 1.5 SAFETY (NEEDLE) ×6 IMPLANT
NEEDLE TARGET 11MM (NEEDLE) ×9 IMPLANT
NS IRRIG 1000ML POUR BTL (IV SOLUTION) ×6 IMPLANT
PACK LAMINECTOMY NEURO (CUSTOM PROCEDURE TRAY) ×6 IMPLANT
PAD ARMBOARD 7.5X6 YLW CONV (MISCELLANEOUS) ×18 IMPLANT
PATTIES SURGICAL .5 X.5 (GAUZE/BANDAGES/DRESSINGS) IMPLANT
PATTIES SURGICAL .5 X1 (DISPOSABLE) IMPLANT
PATTIES SURGICAL 1X1 (DISPOSABLE) IMPLANT
ROD TI PRECONT ILLICO 5.5X9 (Rod) ×6 IMPLANT
SCREW CANN PA ILLICO 6.5X45 (Screw) ×9 IMPLANT
SCREW CANN PA ILLICO 6.5X50 (Screw) ×9 IMPLANT
SCREW SET SPINAL STD HEXALOBE (Screw) ×18 IMPLANT
SLEEVE SURGEON STRL (DRAPES) ×3 IMPLANT
SPONGE GAUZE 4X4 12PLY (GAUZE/BANDAGES/DRESSINGS) IMPLANT
SPONGE LAP 4X18 X RAY DECT (DISPOSABLE) IMPLANT
STAPLER SKIN PROX WIDE 3.9 (STAPLE) ×3 IMPLANT
STRIP CLOSURE SKIN 1/2X4 (GAUZE/BANDAGES/DRESSINGS) IMPLANT
SUT VIC AB 1 CT1 18XBRD ANBCTR (SUTURE) ×4 IMPLANT
SUT VIC AB 1 CT1 8-18 (SUTURE) ×2
SUT VIC AB 2-0 CT1 18 (SUTURE) ×12 IMPLANT
SUT VIC AB 3-0 SH 8-18 (SUTURE) ×15 IMPLANT
SYR 20ML ECCENTRIC (SYRINGE) ×6 IMPLANT
SYR INSULIN 1ML 31GX6 SAFETY (SYRINGE) IMPLANT
TAPE CLOTH 3X10 TAN LF (GAUZE/BANDAGES/DRESSINGS) ×9 IMPLANT
TIP TROCAR NITINOL ILLICO 18 (INSTRUMENTS) ×18 IMPLANT
TOWEL OR 17X24 6PK STRL BLUE (TOWEL DISPOSABLE) ×6 IMPLANT
TOWEL OR 17X26 10 PK STRL BLUE (TOWEL DISPOSABLE) ×6 IMPLANT
TRAY FOLEY CATH 14FRSI W/METER (CATHETERS) ×3 IMPLANT
WATER STERILE IRR 1000ML POUR (IV SOLUTION) ×3 IMPLANT

## 2012-06-28 NOTE — Preoperative (Signed)
Beta Blockers   Reason not to administer Beta Blockers:Not Applicable 

## 2012-06-28 NOTE — Interval H&P Note (Signed)
History and Physical Interval Note:  06/28/2012 6:34 AM  Dale Woods  has presented today for surgery, with the diagnosis of Scoliosis, Lumbar stenosis, Lumbar spondylosis, lumbar radiculopathy  The various methods of treatment have been discussed with the patient and family. After consideration of risks, benefits and other options for treatment, the patient has consented to  Procedure(s) (LRB): ANTERIOR LATERAL LUMBAR FUSION 2 LEVELS (Right) LUMBAR PERCUTANEOUS PEDICLE SCREW 2 LEVEL (N/A) as a surgical intervention .  The patient's history has been reviewed, patient examined, no change in status, stable for surgery.  I have reviewed the patient's chart and labs.  Questions were answered to the patient's satisfaction.     Marilena Trevathan D  Date of Initial H&P: 06/27/2012  History reviewed, patient examined, no change in status, stable for surgery.

## 2012-06-28 NOTE — Progress Notes (Signed)
back

## 2012-06-28 NOTE — Progress Notes (Signed)
Awake,  Alert, conversant.  Full bilateral lower extremity strength in DF and PF.  Doing well.

## 2012-06-28 NOTE — Care Management Note (Signed)
    Page 1 of 2   07/01/2012     2:25:16 PM   CARE MANAGEMENT NOTE 07/01/2012  Patient:  Dale Woods, Dale Woods   Account Number:  [id number]  Date Initiated:  06/28/2012  Documentation initiated by:  Onnie Boer  Subjective/Objective Assessment:   PT WAS ADMITTED FOR BACK SURGERY     Action/Plan:   PROGRESSION OF CARE AND DISCHARGE PLANNING   Anticipated DC Date:  07/01/2012   Anticipated DC Plan:  HOME W HOME HEALTH SERVICES      DC Planning Services  CM consult      Choice offered to / List presented to:  C-1 Patient   DME arranged  [facility]      DME agency  [facility]     HH arranged  HH-2 PT      [facility] agency  [facility]   Status of service:  Completed, signed off Medicare Important Message given?   (If response is "NO", the following Medicare IM given date fields will be blank) Date Medicare IM given:   Date Additional Medicare IM given:    Discharge Disposition:  HOME W HOME HEALTH SERVICES  Per UR Regulation:  Reviewed for med. necessity/level of care/duration of stay  If discussed at Long Length of Stay Meetings, dates discussed:    Comments:  07/01/12 Onnie Boer, RN, BSN 1424 PT WAS DC'D TO HOME WITH HH PT AND A [facility] WITH SEAT THAT THE PT HAS AGREED TO PICK UP AT THE [facility] STORE.  06/28/12 Onnie Boer, RN, BSN 1415 PT WAS ADMITTED FOR A ANTERIOR LATERAL LUMBAR FUSION 2 LEVELS (Right) & LUMBAR PERCUTANEOUS PEDICLE SCREW 2 LEVEL (N/A).  PTA PT WAS AT HOME WITH SELF CARE.  WILL F/U ON DC PLANS AND PT/OT EVAL.

## 2012-06-28 NOTE — OR Nursing (Signed)
Right side incision closed and first procedure complete at 1021, patient moved from lateral position to prone position at 1025, prone position lumbar incision at 1046. Baldomero Lamy RN

## 2012-06-28 NOTE — Anesthesia Postprocedure Evaluation (Signed)
  Anesthesia Post-op Note  Patient: Dale Woods  Procedure(s) Performed: Procedure(s) (LRB): ANTERIOR LATERAL LUMBAR FUSION 2 LEVELS (Right) LUMBAR PERCUTANEOUS PEDICLE SCREW 2 LEVEL (N/A)  Patient Location: PACU  Anesthesia Type: General  Level of Consciousness: awake, alert  and oriented  Airway and Oxygen Therapy: Patient Spontanous Breathing and Patient connected to nasal cannula oxygen  Post-op Pain: mild  Post-op Assessment: Post-op Vital signs reviewed  Post-op Vital Signs: Reviewed  Complications: No apparent anesthesia complications

## 2012-06-28 NOTE — Op Note (Signed)
06/28/2012  12:38 PM  PATIENT:  Dale Woods  61 y.o. male  PRE-OPERATIVE DIAGNOSIS:  Scoliosis, Lumbar stenosis, Lumbar spondylosis, lumbar radiculopathy  POST-OPERATIVE DIAGNOSIS:  Scoliosis, Lumbar stenosis, Lumbar spondylosis, lumbar radiculopathy  PROCEDURE:  Procedure(s) (LRB): ANTERIOR LATERAL LUMBAR FUSION 2 LEVELS (Right) L3/4 and L4/5 LUMBAR PERCUTANEOUS PEDICLE SCREW 2 LEVEL L3-5 Bilaterally (N/A)  SURGEON:  Surgeon(s) and Role:    * Maeola Harman, MD - Primary    * Reinaldo Meeker, MD - Assisting  PHYSICIAN ASSISTANT:   ASSISTANTS: Poteat, RN   ANESTHESIA:   general  EBL:  Total I/O In: 3250 [I.V.:3000; IV Piggyback:250] Out: 385 [Urine:375; Blood:10]  BLOOD ADMINISTERED:none  DRAINS: none   LOCAL MEDICATIONS USED:  LIDOCAINE   SPECIMEN:  No Specimen  DISPOSITION OF SPECIMEN:  N/A  COUNTS:  YES  TOURNIQUET:  * No tourniquets in log *  DICTATION: DICTATION: Patient is a 61 year old with severe spondylosis stenosis and scoliosis of the lumbar spine. It was elected to taken to surgery for anterolateral decompression and posterior pedicle screw fixation.  Procedure: Patient was brought to the operating room and placed in a left lateral decubitus position on the operative table and using orthogonally projected C-arm fluoroscopy the patient was placed so that the  L3-4 and L4-5 levels were visualized in AP and lateral plane. The patient was then taped into position. The table was flexed so as to expose the L4-5 level as the patient has a high iliac crest. Skin was marked along with a posterior finger dissection incision. His flank was then prepped and draped in usual sterile fashion and incisions were made sequentially at L4-5 and L3-4 levels. Posterior finger dissection was made to enter the retroperitoneal space and then subsequently the probe was inserted into the psoas muscle from the right side initially at the L4-5 level. After mapping the neural elements  were able to dock the probe per the midpoint of this vertebral level and without indications electrically of too close proximity to the neural tissues. Subsequently the self-retaining tractor was.after sequential dilators were utilized the shim was employed and the interspace was cleared of psoas muscle and then incised. A thorough discectomy was performed. Instruments were used to clear the interspace of disc material. After thorough discectomy was performed and this was performed using AP and lateral fluoroscopy a 12 lordotic by 55 x 22 mm implant was packed with BMP and NexOss with autologous blood. This was tamped into position using the slides and its position was confirmed on AP and lateral fluoroscopy. Subsequently exposure was performed at the L3-4 level and similar dissection was performed with locking of the self-retaining retractor. At this level were able to place a 8 standard by 22 x 50 mm implant packed in a similar fashion. Hemostasis was assured the wounds were irrigated and closed with interrupted Vicryl sutures.  Patient was then turned into a prone position on the operating table on chest rolls and using AP and lateral fluoroscopy throughout this portion of the procedure, pedicle screws were placed using alphatech cannulated percutaneous screws. 2 screws were placed at L3 and (6.5 x 50 on left and 6.5 x45 mm on right) and two at L4 of a similar size and 2 at L5 of similar size. 90 mm rods were then affixed to the screw heads do a separate stab incision and locked down on the screws. The right L4/5 level was compressed. All connections were then torqued and the Towers were disassembled. The wounds were irrigated  and then closed with 1, 2-0 and 3-0 Vicryl stitches. Sterile occlusive dressing was placed with Dermabond. The patient was then extubated in the operating room and taken to recovery in stable and satisfactory condition having tolerated his operation well. Counts were correct at the end of  the case.   PLAN OF CARE: Admit to inpatient   PATIENT DISPOSITION:  PACU - hemodynamically stable.   Delay start of Pharmacological VTE agent (>24hrs) due to surgical blood loss or risk of bleeding: yes

## 2012-06-28 NOTE — Transfer of Care (Signed)
Immediate Anesthesia Transfer of Care Note  Patient: Dale Woods  Procedure(s) Performed: Procedure(s) (LRB): ANTERIOR LATERAL LUMBAR FUSION 2 LEVELS (Right) LUMBAR PERCUTANEOUS PEDICLE SCREW 2 LEVEL (N/A)  Patient Location: PACU  Anesthesia Type: General  Level of Consciousness: awake, alert , oriented and sedated  Airway & Oxygen Therapy: Patient Spontanous Breathing and Patient connected to nasal cannula oxygen  Post-op Assessment: Report given to PACU RN, Post -op Vital signs reviewed and stable and Patient moving all extremities  Post vital signs: Reviewed and stable  Complications: No apparent anesthesia complications

## 2012-06-28 NOTE — Progress Notes (Signed)
As above.

## 2012-06-28 NOTE — Progress Notes (Signed)
Patient ID: Dale Woods, male   DOB: 05/15/1951, 61 y.o.   MRN: [id number]  Awake, conversant. Back "soreness" only at present. No buttock or leg pain. Good strength BLE. Dermabond all incisions.  Plan to mobilize in LSO with PT.   Georgiann Cocker, RN, BSN

## 2012-06-28 NOTE — Anesthesia Preprocedure Evaluation (Signed)
Anesthesia Evaluation  Patient identified by MRN, date of birth, ID band Patient awake    Reviewed: Allergy & Precautions, H&P , NPO status , Patient's Chart, lab work & pertinent test results  Airway Mallampati: I TM Distance: >3 FB Neck ROM: Full    Dental  (+) Teeth Intact and Dental Advisory Given   Pulmonary COPD COPD inhaler,  breath sounds clear to auscultation        Cardiovascular Rhythm:Regular Rate:Normal     Neuro/Psych    GI/Hepatic   Endo/Other    Renal/GU      Musculoskeletal   Abdominal   Peds  Hematology   Anesthesia Other Findings   Reproductive/Obstetrics                           Anesthesia Physical Anesthesia Plan  ASA: II  Anesthesia Plan: General   Post-op Pain Management:    Induction: Intravenous  Airway Management Planned: Oral ETT  Additional Equipment:   Intra-op Plan:   Post-operative Plan: Extubation in OR  Informed Consent: I have reviewed the patients History and Physical, chart, labs and discussed the procedure including the risks, benefits and alternatives for the proposed anesthesia with the patient or authorized representative who has indicated his/her understanding and acceptance.   Dental advisory given  Plan Discussed with: Anesthesiologist, Surgeon and CRNA  Anesthesia Plan Comments:         Anesthesia Quick Evaluation

## 2012-06-29 NOTE — Progress Notes (Signed)
Subjective: Patient reports "I'm waiting for her to come help me with my breakfast. My back hurts some."  Objective: Vital signs in last 24 hours: Temp:  [97.3 F (36.3 C)-98.8 F (37.1 C)] 98.1 F (36.7 C) (08/14 0600) Pulse Rate:  [71-83] 83  (08/14 0600) Resp:  [9-18] 16  (08/14 0626) BP: (103-148)/(54-84) 112/61 mmHg (08/14 0600) SpO2:  [96 %-100 %] 96 % (08/14 0626) FiO2 (%):  [100 %] 100 % (08/13 1834) Weight:  [81.33 kg (179 lb 4.8 oz)] 81.33 kg (179 lb 4.8 oz) (08/14 0042)  Intake/Output from previous day: 08/13 0701 - 08/14 0700 In: 3850 [P.O.:100; I.V.:3500; IV Piggyback:250] Out: 2795 [Urine:2785; Blood:10] Intake/Output this shift:    Alert, conversant. Incisions with Dermabond, no erythema, swelling, or drainage. Good strength BLE. Mobility in bed limited by lumbar pain this am - reassured.  Lab Results: No results found for this basename: WBC:2,HGB:2,HCT:2,PLT:2 in the last 72 hours BMET No results found for this basename: NA:2,K:2,CL:2,CO2:2,GLUCOSE:2,BUN:2,CREATININE:2,CALCIUM:2 in the last 72 hours  Studies/Results: Dg Lumbar Spine 2-3 Views  06/28/2012  *RADIOLOGY REPORT*  Clinical Data: Back pain  DG C-ARM GT 120 MIN,LUMBAR SPINE - 2-3 VIEW  Comparison: Priors from Dr. Fredrich Birks office.  Findings: C-arm films document L3-L5 interbody and posterolateral XLIF fusion.  Satisfactory position and alignment.  IMPRESSION: As above.  Original Report Authenticated By: Elsie Stain, M.D.   Dg C-arm Gt 120 Min  06/28/2012  *RADIOLOGY REPORT*  Clinical Data: Back pain  DG C-ARM GT 120 MIN,LUMBAR SPINE - 2-3 VIEW  Comparison: Priors from Dr. Fredrich Birks office.  Findings: C-arm films document L3-L5 interbody and posterolateral XLIF fusion.  Satisfactory position and alignment.  IMPRESSION: As above.  Original Report Authenticated By: Elsie Stain, M.D.    Assessment/Plan: Improving 1st day post-op.  LOS: 1 day  Mobilize in LSO with PT.  Pt understands to request muscle  relaxers prn (significant straightening of scoliotic curve in lumbar spine post-op)    Dayle Mcnerney, Arlys John 06/29/2012, 8:00 AM

## 2012-06-29 NOTE — Evaluation (Signed)
Physical Therapy Evaluation Patient Details Name: Dale Woods MRN: [id number] DOB: 1951-06-29 Today's Date: 06/29/2012 Time: 4098-1191 PT Time Calculation (min): 31 min  PT Assessment / Plan / Recommendation Clinical Impression  Pt adm for L3-L5 anterolateral decompression and fusion. Pt with limited mobility due to pain, back precautions and the problems listed below. Will benefit from PT for education to incr safety prior to d/c home with assist.    PT Assessment  Patient needs continued PT services    Follow Up Recommendations  Home health PT;Supervision/Assistance - 24 hour    Barriers to Discharge None      Equipment Recommendations  Rolling walker with 5" wheels    Recommendations for Other Services OT consult   Frequency Min 5X/week    Precautions / Restrictions Precautions Precautions: Back Precaution Booklet Issued: Yes (comment) Required Braces or Orthoses: Spinal Brace Spinal Brace: Lumbar corset   Pertinent Vitals/Pain 5/10 back pain; repositioned and RN made aware SaO2 decr to 87% on RA; incr to 92% within 30 sec with 2 L O2 resumed and deep breaths      Mobility  Bed Mobility Bed Mobility: Rolling Right;Right Sidelying to Sit;Sitting - Scoot to [place] of Bed Rolling Right: 4: Min assist Right Sidelying to Sit: 4: Min assist;HOB flat Sitting - Scoot to [place] of Bed: 5: Supervision Details for Bed Mobility Assistance: vc for technique to maintain back precautions; assist due to pain limiting movements Transfers Transfers: Sit to Stand;Stand to Sit Sit to Stand: 4: Min guard;With upper extremity assist;From bed Stand to Sit: 4: Min guard;With upper extremity assist;With armrests;To chair/3-in-1 Details for Transfer Assistance: vc for safe use of RW; assist due to pain and dizziness Ambulation/Gait Ambulation/Gait Assistance: 4: Min assist Ambulation Distance (Feet): 30 Feet Assistive device: Rolling walker Ambulation/Gait Assistance Details: good  upright posture, very short stride length; advancing RW and then stepping (unable to fluidly walk and push RW yet due to pain) Gait Pattern: Step-to pattern;Decreased stride length;Decreased hip/knee flexion - right;Decreased hip/knee flexion - left;Right foot flat;Left foot flat;Shuffle;Antalgic    Exercises Other Exercises Other Exercises: provided with incentive spirometer (ordered, not yet in room)   PT Diagnosis: Difficulty walking;Acute pain  PT Problem List: Decreased activity tolerance;Decreased mobility;Decreased knowledge of use of DME;Decreased knowledge of precautions;Impaired sensation;Pain PT Treatment Interventions: DME instruction;Gait training;Stair training;Functional mobility training;Therapeutic activities;Patient/family education   PT Goals Acute Rehab PT Goals PT Goal Formulation: With patient Time For Goal Achievement: 07/03/12 Potential to Achieve Goals: Good Pt will Roll Supine to Right Side: with supervision PT Goal: Rolling Supine to Right Side - Progress: Goal set today Pt will go Supine/Side to Sit: with supervision PT Goal: Supine/Side to Sit - Progress: Goal set today Pt will go Sit to Supine/Side: with supervision PT Goal: Sit to Supine/Side - Progress: Goal set today Pt will go Sit to Stand: with supervision PT Goal: Sit to Stand - Progress: Goal set today Pt will Ambulate: >150 feet;with supervision;with least restrictive assistive device PT Goal: Ambulate - Progress: Goal set today Pt will Go Up / Down Stairs: 1-2 stairs;with min assist;with least restrictive assistive device PT Goal: Up/Down Stairs - Progress: Goal set today Additional Goals Additional Goal #1: Pt will verbalize and perform all above activities while adhering to back precautions. PT Goal: Additional Goal #1 - Progress: Goal set today  Visit Information  Last PT Received On: 06/29/12 Assistance Needed: +1    Subjective Data  Subjective: Reports he will stay with his girlfriend on  d/c  Patient Stated Goal: ultimately return to work   Prior Functioning  Home Living Lives With: Significant other (going to girlfriend's house) Available Help at Discharge: Friend(s);Available 24 hours/day Type of Home: House Home Access: Stairs to enter [employer] of Steps: 2 Entrance Stairs-Rails: None Home Layout: One level Bathroom Shower/Tub: Forensic scientist: Standard Bathroom Accessibility: Yes How Accessible: Accessible via walker Home Adaptive Equipment: Bedside commode/3-in-1;Other (comment) (? mother had RW) Prior Function Level of Independence: Independent Able to Take Stairs?: Reciprically Driving: Yes Vocation: Full time employment Academic librarian; [employer]) Communication Communication: No difficulties    Cognition  Overall Cognitive Status: Appears within functional limits for tasks assessed/performed Arousal/Alertness: Lethargic Behavior During Session: Lethargic    Extremity/Trunk Assessment Right Lower Extremity Assessment RLE ROM/Strength/Tone: WFL for tasks assessed RLE Sensation: Deficits RLE Sensation Deficits: numbness entire leg as PTA RLE Coordination: WFL - gross motor Left Lower Extremity Assessment LLE ROM/Strength/Tone: WFL for tasks assessed LLE Sensation: Deficits LLE Sensation Deficits: numbness entire leg as PTA LLE Coordination: WFL - gross motor Trunk Assessment Trunk Assessment: Normal   Balance    End of Session PT - End of Session Equipment Utilized During Treatment: Gait belt Activity Tolerance: Patient limited by fatigue Patient left: in chair;with call bell/phone within reach;with family/visitor present Nurse Communication: Mobility status  GP     Marguis Mathieson 06/29/2012, 10:03 AM  Pager [phone]

## 2012-06-29 NOTE — Progress Notes (Signed)
As above.

## 2012-06-29 NOTE — Evaluation (Signed)
Occupational Therapy Evaluation Patient Details Name: Dale Woods MRN: [id number] DOB: [birth year] Today's Date: 06/29/2012 Time: 4098-1191 OT Time Calculation (min): 19 min  OT Assessment / Plan / Recommendation Clinical Impression  Pt s/p L3-L5 anterolateral decompression and fusion and presents with pain and back precautions which limit I with ADLs. Pt will benefit from skilled OT in the acute setting to maximize I with ADL and ADL mobility prior to d/c    OT Assessment  Patient needs continued OT Services    Follow Up Recommendations  No OT follow up    Barriers to Discharge      Equipment Recommendations  Rolling walker with 5" wheels (and TBD by OT)    Recommendations for Other Services    Frequency  Min 2X/week    Precautions / Restrictions Precautions Precautions: Back Precaution Booklet Issued: Yes (comment) Required Braces or Orthoses: Spinal Brace Spinal Brace: Lumbar corset   Pertinent Vitals/Pain Pt reports back pain with activity but did not rate. Encouraged PCA as pt felt necessary- also, repositioned pt.    ADL  Grooming: Performed;Teeth care;Set up;Supervision/safety Where Assessed - Grooming: Supported sitting Upper Body Bathing: Simulated;Minimal assistance Where Assessed - Upper Body Bathing: Supported sit to stand Lower Body Bathing: Simulated;Moderate assistance Where Assessed - Lower Body Bathing: Supported sit to stand Lower Body Dressing: Simulated;Moderate assistance Where Assessed - Lower Body Dressing: Supported sit to stand Toilet Transfer: Counsellor Method: Sit to Barista:  (from chair with armrests) Toileting - Clothing Manipulation and Hygiene: Simulated;Minimal assistance Where Assessed - Engineer, mining and Hygiene: Standing Equipment Used: Back brace;Gait belt;Rolling walker Transfers/Ambulation Related to ADLs: Min A with RW ambulation within room ADL Comments:  pt drowsy. pt able to recall 2/3 back precautions I'ly and 3rd with assist    OT Diagnosis: Generalized weakness;Acute pain  OT Problem List: Decreased activity tolerance;Impaired balance (sitting and/or standing);Decreased knowledge of use of DME or AE;Decreased knowledge of precautions;Pain;Cardiopulmonary status limiting activity (02 sats dropping to upper 80's when off [place] 02) OT Treatment Interventions: Self-care/ADL training;DME and/or AE instruction;Therapeutic activities;Patient/family education;Balance training   OT Goals Acute Rehab OT Goals OT Goal Formulation: With patient Time For Goal Achievement: 06/29/12 Potential to Achieve Goals: Good ADL Goals Pt Will Perform Grooming: with modified independence;Standing at sink ADL Goal: Grooming - Progress: Goal set today Pt Will Perform Upper Body Dressing: with modified independence;Sitting, bed;Sitting, chair ADL Goal: Upper Body Dressing - Progress: Goal set today Pt Will Perform Lower Body Dressing: with modified independence;Sit to stand from bed;Sit to stand from chair;with adaptive equipment ADL Goal: Lower Body Dressing - Progress: Goal set today Pt Will Transfer to Toilet: with modified independence;Ambulation;with DME ADL Goal: Toilet Transfer - Progress: Goal set today Pt Will Perform Toileting - Hygiene: with modified independence;with adaptive equipment;Sitting on 3-in-1 or toilet;Standing at 3-in-1/toilet ADL Goal: Toileting - Hygiene - Progress: Goal set today Pt Will Perform Tub/Shower Transfer: Tub transfer;with modified independence;Ambulation ADL Goal: Tub/Shower Transfer - Progress: Goal set today Additional ADL Goal #1: Pt will tolerate dynamic standing activities greater than in prep for standing ADLs. ADL Goal: Additional Goal #1 - Progress: Goal set today  Visit Information  Last OT Received On: 06/29/12 Assistance Needed: +1    Subjective Data  Subjective: I like to be able to look at my oxygen  level. Patient Stated Goal: Return home    Prior Functioning  Vision/Perception  Home Living Lives With: Significant other (going to girlfriend's house) Available  Help at Discharge: Friend(s);Available 24 hours/day Type of Home: House Home Access: Stairs to enter [employer] of Steps: 2 Entrance Stairs-Rails: None Home Layout: One level Bathroom Shower/Tub: Forensic scientist: Standard Bathroom Accessibility: Yes How Accessible: Accessible via walker Home Adaptive Equipment: Bedside commode/3-in-1;Other (comment) (? mother had RW) Prior Function Level of Independence: Independent Able to Take Stairs?: Reciprically Driving: Yes Vocation: Full time employment Academic librarian; [employer]) Communication Communication: No difficulties Dominant Hand: Right      Cognition  Overall Cognitive Status: Appears within functional limits for tasks assessed/performed Arousal/Alertness: Lethargic Orientation Level: Appears intact for tasks assessed Behavior During Session: Lethargic    Extremity/Trunk Assessment Right Upper Extremity Assessment RUE ROM/Strength/Tone: Within functional levels RUE Sensation: WFL - Light Touch RUE Coordination: WFL - gross/fine motor Left Upper Extremity Assessment LUE ROM/Strength/Tone: Within functional levels LUE Sensation: WFL - Light Touch LUE Coordination: WFL - gross/fine motor Right Lower Extremity Assessment RLE ROM/Strength/Tone: WFL for tasks assessed RLE Sensation: Deficits RLE Sensation Deficits: numbness entire leg as PTA RLE Coordination: WFL - gross motor Left Lower Extremity Assessment LLE ROM/Strength/Tone: WFL for tasks assessed LLE Sensation: Deficits LLE Sensation Deficits: numbness entire leg as PTA LLE Coordination: WFL - gross motor Trunk Assessment Trunk Assessment: Normal   Mobility Bed Mobility Bed Mobility: Rolling Right;Right Sidelying to Sit;Sitting - Scoot to [place] of Bed Rolling  Right: 4: Min assist Right Sidelying to Sit: 4: Min assist;HOB flat Sitting - Scoot to [place] of Bed: 5: Supervision Details for Bed Mobility Assistance: vc for technique to maintain back precautions; assist due to pain limiting movements Transfers Sit to Stand: 4: Min guard;With armrests;From chair/3-in-1 Stand to Sit: 4: Min guard;With armrests;To chair/3-in-1 Details for Transfer Assistance: VC for hand placement    Exercise Other Exercises Other Exercises: provided with incentive spirometer (ordered, not yet in room)  Balance    End of Session OT - End of Session Equipment Utilized During Treatment: Gait belt;Back brace Activity Tolerance: Patient limited by fatigue Patient left: in chair;with call bell/phone within reach  GO     Caleah Tortorelli 06/29/2012, 1:05 PM

## 2012-06-29 NOTE — Clinical Social Work Note (Signed)
CSW received consult for SNF. CSW reviewed chart and discussed pt during progression with RN. PT is recommending home health PT. RNCM is aware and following. CSW is signing off as no further needs identified. Please reconsult if a need arises prior to discharge.   Dede Query, MSW, [facility]ors [phone]

## 2012-06-29 NOTE — Plan of Care (Signed)
Problem: Phase I Progression Outcomes Goal: Log roll for position change Outcome: Progressing Patient able to roll from side to side to allow for inspection of incision sites.

## 2012-06-29 NOTE — Progress Notes (Signed)
Patient c/o pain and tenderness in lower abdomen area. Pt had previously voided . Pt stated he felt like he could not empty his bladder properly. Pts abdomen was firm and tender. Bladder scan revealed >943mL urine. Pt in and out cath'd and removed from patient's bladder. Pt verbalized relief. Will continue to monitor.

## 2012-06-30 ENCOUNTER — Encounter (HOSPITAL_COMMUNITY): Payer: Self-pay | Admitting: Neurosurgery

## 2012-06-30 MED ORDER — DIAZEPAM 5 MG PO TABS
5.0000 mg | ORAL_TABLET | Freq: Four times a day (QID) | ORAL | Status: DC | PRN
  Administered 2012-06-30 – 2012-07-01 (×4): 5 mg via ORAL
  Filled 2012-06-30 (×4): qty 1

## 2012-06-30 NOTE — Progress Notes (Signed)
Occupational Therapy Treatment Patient Details Name: Dale Woods MRN: [id number] DOB: 06-11-1951 Today's Date: 06/30/2012 Time: 4098-1191 OT Time Calculation (min): 27 min  OT Assessment / Plan / Recommendation Comments on Treatment Session Pt progressing well with therapy. Session focused on tub/shower transfer and AE education.    Follow Up Recommendations  No OT follow up;Supervision/Assistance - 24 hour (initially)    Barriers to Discharge       Equipment Recommendations  Rolling walker with 5" wheels;Tub/shower seat (with back)    Recommendations for Other Services    Frequency     Plan Discharge plan remains appropriate    Precautions / Restrictions Precautions Precautions: Back Precaution Comments: Patient able to recall all precautions.  Required Braces or Orthoses: Spinal Brace Spinal Brace: Lumbar corset;Applied in sitting position Spinal Brace Comments: able to don brace with setup Restrictions Weight Bearing Restrictions: No   Pertinent Vitals/Pain Pt reports 4/10 back pain and states he is pre-medicated    ADL  Upper Body Bathing: Simulated;Min guard Where Assessed - Upper Body Bathing: Unsupported standing Lower Body Bathing: Simulated;Minimal assistance Where Assessed - Lower Body Bathing: Unsupported standing Lower Body Dressing: Simulated;Minimal assistance Where Assessed - Lower Body Dressing: Supported sit to stand (used Sports administrator and sock aid) Toilet Transfer: Counsellor Method: Sit to Barista: Materials engineer and Hygiene: Simulated;Minimal assistance Where Assessed - Engineer, mining and Hygiene: Standing Tub/Shower Transfer: Simulated;Min guard Tub/Shower Transfer Method: Ambulating (sideways entry/exit with UE support on RW and wall) Equipment Used: Back brace;Gait belt;Rolling walker Transfers/Ambulation Related to ADLs: Min guard A with RW  ambulation throughout room ADL Comments: Pt with incr need for assist for sit to stand from lower surface (bed) without the availability of armrests. Educated pt on and demonstrated use of AE- pt able to return demonstration of sock aid and reacher with Min to Mod VC. Also, d/w pt need for tub/shower seat to which he agrees he will benefit from.    OT Diagnosis:    OT Problem List:   OT Treatment Interventions:     OT Goals ADL Goals ADL Goal: Upper Body Dressing - Progress: Progressing toward goals ADL Goal: Lower Body Dressing - Progress: Progressing toward goals ADL Goal: Toilet Transfer - Progress: Progressing toward goals ADL Goal: Toileting - Hygiene - Progress: Progressing toward goals ADL Goal: Tub/Shower Transfer - Progress: Progressing toward goals ADL Goal: Additional Goal #1 - Progress: Progressing toward goals  Visit Information  Last OT Received On: 06/30/12 Assistance Needed: +1    Subjective Data      Prior Functioning       Cognition  Overall Cognitive Status: Appears within functional limits for tasks assessed/performed Arousal/Alertness: Awake/alert Orientation Level: Appears intact for tasks assessed Behavior During Session: [facility] for tasks performed    Mobility Transfers Sit to Stand: 4: Min assist;From bed;With upper extremity assist Stand to Sit: 4: Min guard;With armrests;To chair/3-in-1 Details for Transfer Assistance: Pt with difficulty standing from lower surface (bed). Educated on safe technique. Min guard A from chair with armrests and 3n1   Exercises    Balance    End of Session OT - End of Session Equipment Utilized During Treatment: Gait belt;Back brace Activity Tolerance: Patient tolerated treatment well Patient left: in chair;with call bell/phone within reach Nurse Communication: Mobility status  GO     Eden Rho 06/30/2012, 2:24 PM

## 2012-06-30 NOTE — Progress Notes (Signed)
Subjective: Patient reports doing well  Objective: Vital signs in last 24 hours: Temp:  [98.1 F (36.7 C)-100.2 F (37.9 C)] 98.1 F (36.7 C) (08/15 0600) Pulse Rate:  [70-98] 78  (08/15 0600) Resp:  [10-18] 14  (08/15 0800) BP: (107-136)/(61-66) 119/65 mmHg (08/15 0600) SpO2:  [95 %-99 %] 97 % (08/15 0800)  Intake/Output from previous day: 08/14 0701 - 08/15 0700 In: 2340 [P.O.:1440; I.V.:900] Out: 4000 [Urine:4000] Intake/Output this shift:    Physical Exam: Full strength, no numbness, dressings CDI  Lab Results: No results found for this basename: WBC:2,HGB:2,HCT:2,PLT:2 in the last 72 hours BMET No results found for this basename: NA:2,K:2,CL:2,CO2:2,GLUCOSE:2,BUN:2,CREATININE:2,CALCIUM:2 in the last 72 hours  Studies/Results: Dg Lumbar Spine 2-3 Views  06/28/2012  *RADIOLOGY REPORT*  Clinical Data: Back pain  DG C-ARM GT 120 MIN,LUMBAR SPINE - 2-3 VIEW  Comparison: Priors from Dr. Fredrich Birks office.  Findings: C-arm films document L3-L5 interbody and posterolateral XLIF fusion.  Satisfactory position and alignment.  IMPRESSION: As above.  Original Report Authenticated By: Elsie Stain, M.D.   Dg C-arm Gt 120 Min  06/28/2012  *RADIOLOGY REPORT*  Clinical Data: Back pain  DG C-ARM GT 120 MIN,LUMBAR SPINE - 2-3 VIEW  Comparison: Priors from Dr. Fredrich Birks office.  Findings: C-arm films document L3-L5 interbody and posterolateral XLIF fusion.  Satisfactory position and alignment.  IMPRESSION: As above.  Original Report Authenticated By: Elsie Stain, M.D.    Assessment/Plan: D/C PCA.  Continue to mobilize with PT.  Anticipate D/C in am.    LOS: 2 days    Dorian Heckle, MD 06/30/2012, 8:55 AM

## 2012-06-30 NOTE — Progress Notes (Signed)
Physical Therapy Treatment Patient Details Name: Dale Woods MRN: [id number] DOB: 05/02/1951 Today's Date: 06/30/2012 Time: 0740-0800 PT Time Calculation (min): 20 min  PT Assessment / Plan / Recommendation Comments on Treatment Session  Patient progressing well with ambulation this session. States that he has been walking with staff and is feeling better. Plan on attempting stairs next visit    Follow Up Recommendations  Home health PT;Supervision/Assistance - 24 hour    Barriers to Discharge        Equipment Recommendations  Rolling walker with 5" wheels    Recommendations for Other Services    Frequency Min 5X/week   Plan Discharge plan remains appropriate;Frequency remains appropriate    Precautions / Restrictions Precautions Precautions: Back Precaution Comments: Patient able to recall all precautions.  Required Braces or Orthoses: Spinal Brace Spinal Brace: Lumbar corset   Pertinent Vitals/Pain     Mobility  Bed Mobility Bed Mobility: Sit to Sidelying Right Sit to Sidelying Right: 5: Supervision;With rail Details for Bed Mobility Assistance: Patient with heavy reliance on rails but able to complete with just cueing Transfers Sit to Stand: 4: Min guard;With upper extremity assist;With armrests;From chair/3-in-1 Stand to Sit: 4: Min guard;With upper extremity assist;To bed Details for Transfer Assistance: Cues for safe hand placement with sitting.  Ambulation/Gait Ambulation/Gait Assistance: 4: Min guard Ambulation Distance (Feet): 300 Feet Assistive device: Rolling walker Ambulation/Gait Assistance Details: Cues to increase weight through LEs and not to rely so much on RW.  Gait Pattern: Step-through pattern;Decreased stride length    Exercises     PT Diagnosis:    PT Problem List:   PT Treatment Interventions:     PT Goals Acute Rehab PT Goals PT Goal: Supine/Side to Sit - Progress: Progressing toward goal PT Goal: Sit to Supine/Side - Progress:  Progressing toward goal PT Goal: Sit to Stand - Progress: Progressing toward goal PT Goal: Ambulate - Progress: Progressing toward goal  Visit Information  Last PT Received On: 06/30/12 Assistance Needed: +1    Subjective Data      Cognition  Overall Cognitive Status: Appears within functional limits for tasks assessed/performed Arousal/Alertness: Awake/alert Orientation Level: Appears intact for tasks assessed Behavior During Session: [facility] for tasks performed    Balance     End of Session PT - End of Session Equipment Utilized During Treatment: Gait belt;Back brace Activity Tolerance: Patient tolerated treatment well Patient left: in bed;with call bell/phone within reach Nurse Communication: Mobility status   GP     Fredrich Birks 06/30/2012, 8:09 AM 06/30/2012 Fredrich Birks PTA [phone] pager [phone] office

## 2012-07-01 NOTE — Progress Notes (Signed)
Physical Therapy Treatment Patient Details Name: Dale Woods MRN: [id number] DOB: 12-31-1950 Today's Date: 07/01/2012 Time: 4098-1191 PT Time Calculation (min): 19 min  PT Assessment / Plan / Recommendation Comments on Treatment Session  Pt is ready for d/c from a mobility/PT perspective.    Follow Up Recommendations  Home health PT;Supervision for mobility/OOB    Barriers to Discharge        Equipment Recommendations  Rolling walker with 5" wheels;Tub/shower seat    Recommendations for Other Services    Frequency Min 5X/week   Plan Discharge plan remains appropriate;Frequency remains appropriate    Precautions / Restrictions Precautions Precautions: Back Precaution Booklet Issued: Yes (comment) Precaution Comments: Patient able to recall all precautions.  Required Braces or Orthoses: Spinal Brace Spinal Brace: Lumbar corset;Applied in sitting position Spinal Brace Comments: able to don brace with setup   Pertinent Vitals/Pain Incisional; # not rated    Mobility  Bed Mobility Bed Mobility: Rolling Right;Right Sidelying to Sit;Sitting - Scoot to [place] of Bed;Sit to Sidelying Right;Rolling Left Rolling Right: 6: Modified independent (Device/Increase time) Rolling Left: 6: Modified independent (Device/Increase time) Right Sidelying to Sit: 6: Modified independent (Device/Increase time);HOB flat Sitting - Scoot to Edge of Bed: 6: Modified independent (Device/Increase time) Sit to Sidelying Right: 6: Modified independent (Device/Increase time);HOB flat Details for Bed Mobility Assistance: incr time, however no cues needed and adhered to all back precautions Transfers Transfers: Sit to Stand;Stand to Sit Sit to Stand: 5: Supervision;With upper extremity assist;From bed Stand to Sit: 5: Supervision;With upper extremity assist;To bed Details for Transfer Assistance: Pt prefers to push down on bil handles of RW as transitioning to/from standing. He is tall enough that he is  able to do this without tipping RW. Discussed possible safety concerns and pt able to demonstrate safe use of RW in this manner Ambulation/Gait Ambulation/Gait Assistance: 5: Supervision Ambulation Distance (Feet): 150 Feet Assistive device: Rolling walker Ambulation/Gait Assistance Details: good upright posture; now gliding RW as he walks with improved step length. Discussed possible reasons he will still need RW at home (decr pain, wobbly from pain meds) and likely use for < 1 week. Gait Pattern: Step-through pattern;Decreased stride length Stairs: Yes Stairs Assistance: 4: Min assist Stairs Assistance Details (indicate cue type and reason): instructional cues for pt and girlfriend Stair Management Technique: No rails;Step to pattern;Forwards Number of Stairs: 2  (done twice)    Exercises     PT Diagnosis:    PT Problem List:   PT Treatment Interventions:     PT Goals Acute Rehab PT Goals Pt will Roll Supine to Right Side: with supervision PT Goal: Rolling Supine to Right Side - Progress: Met Pt will go Supine/Side to Sit: with supervision PT Goal: Supine/Side to Sit - Progress: Met Pt will go Sit to Supine/Side: with supervision PT Goal: Sit to Supine/Side - Progress: Met Pt will go Sit to Stand: with supervision PT Goal: Sit to Stand - Progress: Met Pt will Ambulate: >150 feet;with supervision;with least restrictive assistive device PT Goal: Ambulate - Progress: Met Pt will Go Up / Down Stairs: 1-2 stairs;with min assist;with least restrictive assistive device PT Goal: Up/Down Stairs - Progress: Met Additional Goals Additional Goal #1: Pt will verbalize and perform all above activities while adhering to back precautions. 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

## 2012-07-01 NOTE — Progress Notes (Signed)
Dc instructions provided. Pt verbalized understanding. Pt iv dc intact. Pt under no s/s distress. Home pt and seat for tube set up by case management.

## 2012-07-01 NOTE — Progress Notes (Signed)
Occupational Therapy Treatment Patient Details Name: Dale Woods MRN: [id number] DOB: [birth year] Today's Date: 07/01/2012 Time: 9562-1308 OT Time Calculation (min): 16 min  OT Assessment / Plan / Recommendation Comments on Treatment Session Pt progressing well.    Follow Up Recommendations  No OT follow up;Supervision/Assistance - 24 hour    Barriers to Discharge       Equipment Recommendations  Rolling walker with 5" wheels;Tub/shower seat (with back)    Recommendations for Other Services    Frequency     Plan      Precautions / Restrictions Precautions Precautions: Back Precaution Booklet Issued: Yes (comment) Precaution Comments: Patient able to recall all precautions.  Required Braces or Orthoses: Spinal Brace Spinal Brace: Lumbar corset;Applied in sitting position Spinal Brace Comments: able to don brace with setup   Pertinent Vitals/Pain Pt reports back pain and received pain medication from nurse. Did not hear pain rating    ADL  Eating/Feeding: Performed;Independent Where Assessed - Eating/Feeding: Edge of bed Tub/Shower Transfer: Simulated;Min guard Tub/Shower Transfer Method: Ambulating (sideways entry/exit) Equipment Used: Back brace;Gait belt;Rolling walker ADL Comments: Pt's girlfriend in room and educated on tub/shower transfer and use of AE. girlfriend able to verbalize understanding and demonstrate ability to assist pt safely.     OT Diagnosis:    OT Problem List:   OT Treatment Interventions:     OT Goals ADL Goals ADL Goal: Tub/Shower Transfer - Progress: Progressing toward goals ADL Goal: Additional Goal #1 - Progress: Progressing toward goals  Visit Information  Last OT Received On: 07/01/12 Assistance Needed: +1    Subjective Data      Prior Functioning       Cognition  Overall Cognitive Status: Appears within functional limits for tasks assessed/performed Arousal/Alertness: Awake/alert Orientation Level: Appears intact for  tasks assessed Behavior During Session: [facility] for tasks performed    Mobility Bed Mobility Bed Mobility: Rolling Right;Right Sidelying to Sit;Sitting - Scoot to [place] of Bed;Sit to Sidelying Right;Rolling Left Rolling Right: 6: Modified independent (Device/Increase time) Rolling Left: 6: Modified independent (Device/Increase time) Right Sidelying to Sit: 6: Modified independent (Device/Increase time);HOB flat Sitting - Scoot to Edge of Bed: 6: Modified independent (Device/Increase time) Sit to Sidelying Right: 6: Modified independent (Device/Increase time);HOB flat Details for Bed Mobility Assistance: incr time, however no cues needed and adhered to all back precautions Transfers Sit to Stand: 5: Supervision;With upper extremity assist;From bed Stand to Sit: 5: Supervision;With upper extremity assist;To bed Details for Transfer Assistance: Pt prefers to push down on bil handles of RW as transitioning to/from standing. He is tall enough that he is able to do this without tipping RW. Discussed possible safety concerns and pt able to demonstrate safe use of RW in this manner   Exercises    Balance    End of Session OT - End of Session Equipment Utilized During Treatment: Gait belt;Back brace Activity Tolerance: Patient tolerated treatment well Patient left: in chair;with call bell/phone within reach Nurse Communication: Mobility status  GO     Alliene Klugh 07/01/2012, 12:14 PM

## 2012-07-01 NOTE — Progress Notes (Signed)
As above.

## 2012-07-01 NOTE — Discharge Summary (Signed)
Physician Discharge Summary  Patient ID: Dale Woods MRN: [id number] DOB/AGE: Nov 20, 1950 61 y.o.  Admit date: 06/28/2012 Discharge date: 07/01/2012  Admission Diagnoses: Scoliosis, Lumbar stenosis, Lumbar spondylosis, lumbar radiculopathy   Discharge Diagnoses: Scoliosis, Lumbar stenosis, Lumbar spondylosis, lumbar radiculopathy s/p ANTERIOR LATERAL LUMBAR FUSION 2 LEVELS (Right) L3/4 and L4/5 LUMBAR PERCUTANEOUS PEDICLE SCREW 2 LEVEL L3-5 Bilaterally   Active Problems:  * No active hospital problems. *    Discharged Condition: good  Hospital Course: [name] was admitted 06-28-12 with Dx of scoliosis,  Stenosis, and lumbar radiculopathy. Following uncomplicated antero-lateral decompression and fusion at L3-4 & L4-5 with percutaneous pedicle screws bilaterally, he recovered nicely in Neuro PACU and transferred to 4N for therapy & nursing care. He has progressed well.  Consults: None  Significant Diagnostic Studies: radiology: X-Ray: intra-operative  Treatments: surgery: ANTERIOR LATERAL LUMBAR FUSION 2 LEVELS (Right) L3/4 and L4/5 LUMBAR PERCUTANEOUS PEDICLE SCREW 2 LEVEL L3-5 Bilaterally    Discharge Exam: Blood pressure 124/68, pulse 88, temperature 97.8 F (36.6 C), temperature source Oral, resp. rate 16, height 6\' 2"  (1.88 m), weight 81.33 kg (179 lb 4.8 oz), SpO2 95.00%. Alert, conversant. Pain controlled with Percocet & Valium. Good strength BLE. Incisions with Dermabond. No erythema, swelling, or drainage. No BM yet post-op, but pt feels this will be resolved at home today.    Disposition: D/C to home. Rx's given: Percocet 5/325 1-2 po q 4-6hrs prn pain #60; Valium 5mg  po q6hrs prn spasm #60. Pt verbalizes understanding of d/c instructions. He will call office to schedule 3-4week f/u appt with Dr. Venetia Maxon. (Pt agrees to stay off Naproxen for 8weeks post-op.)   Discharge Orders    Future Appointments: Provider: Department: Dept Phone: Center:   05/29/2013 1:30  PM Barbaraann Share, MD [facility] [phone] None     Medication List  As of 07/01/2012  9:05 AM   ASK your doctor about these medications         acetaminophen 325 MG tablet   Commonly known as: TYLENOL   Take 650 mg by mouth every 6 (six) hours as needed. pain      ciprofloxacin 500 MG tablet   Commonly known as: CIPRO   Take 500 mg by mouth 2 (two) times daily.      gemfibrozil 600 MG tablet   Commonly known as: LOPID   Take 600 mg by mouth daily.      metroNIDAZOLE 500 MG tablet   Commonly known as: FLAGYL   Take 500 mg by mouth 3 (three) times daily.      naproxen 500 MG tablet   Commonly known as: NAPROSYN   Take 500 mg by mouth 2 (two) times daily as needed. Pain      PROVENTIL HFA 108 (90 BASE) MCG/ACT inhaler   Generic drug: albuterol   Inhale 2 puffs into the lungs every 6 (six) hours as needed. Shortness of breath      tiotropium 18 MCG inhalation capsule   Commonly known as: SPIRIVA   Place 18 mcg into inhaler and inhale daily.      ULTRA OMEGA-3 FISH OIL 1400 MG Caps   Take 1 tablet by mouth daily.      Vitamin D 1000 UNITS capsule   Take 1,000 Units by mouth daily.             Signed: Georgiann Cocker 07/01/2012, 9:05 AM

## 2012-07-01 NOTE — Progress Notes (Signed)
Subjective: Patient reports "I believe I'm doing ok"  Objective: Vital signs in last 24 hours: Temp:  [97.8 F (36.6 C)-98.6 F (37 C)] 97.8 F (36.6 C) (08/16 0606) Pulse Rate:  [60-90] 88  (08/16 0606) Resp:  [14-16] 16  (08/16 0606) BP: (107-138)/(53-72) 124/68 mmHg (08/16 0606) SpO2:  [93 %-99 %] 95 % (08/16 0606)  Intake/Output from previous day:   Intake/Output this shift:    Alert, conversant. Pain controlled with Percocet & Valium. Good strength BLE. Incisions with Dermabond. No erythema, swelling, or drainage. No BM yet post-op, but pt feels this will be resolved at home today.  Lab Results: No results found for this basename: WBC:2,HGB:2,HCT:2,PLT:2 in the last 72 hours BMET No results found for this basename: NA:2,K:2,CL:2,CO2:2,GLUCOSE:2,BUN:2,CREATININE:2,CALCIUM:2 in the last 72 hours  Studies/Results: No results found.  Assessment/Plan: Improved  LOS: 3 days  Per Dr. Venetia Maxon, d/c IV, d/c to home. rx's given: Percocet 5/325 1-2 po q 4-6hrs prn pain #60; Valium 5mg  po q6hrs prn spasm #60. Pt verbalizes understanding of d/c instructions. He will call office to schedule 3-4week f/u appt with Dr. Venetia Maxon.   Georgiann Cocker 07/01/2012, 9:00 AM

## 2012-07-04 NOTE — Discharge Summary (Signed)
As above.

## 2012-11-24 ENCOUNTER — Telehealth: Payer: Self-pay | Admitting: Pulmonary Disease

## 2012-11-24 NOTE — Telephone Encounter (Signed)
done

## 2012-11-24 NOTE — Telephone Encounter (Signed)
Spoke with patient in regards to paperwork dropped off this AM. Patient needing paperwork filled out and faxed to [facility] for Rx coverage.  Patient aware that form filled out for 90-Day Supply of both Albuterol and Spiriva.   Paperwork complete and placed in given to [employer] [facility] [employer] to sign. Once signed fax to [phone] and put to scan.

## 2012-11-25 NOTE — Telephone Encounter (Signed)
lori faxed this back yesterday. Nothing further was needed

## 2012-12-19 ENCOUNTER — Telehealth: Payer: Self-pay | Admitting: Pulmonary Disease

## 2012-12-19 NOTE — Telephone Encounter (Signed)
I spoke with the pt and he states that the proventil is not covered and would cost him $200. Pt states he used to take proair and wants an rx sent for this but states that they tod him this may need a PA as well. He states they provided him with a number for PA [phone].  I advised the pt that I will call them and figure out what med is covered. I called number given and was advised that they could only fax preferred drug list so I requested this be faxed to triage fax. Will await fax. Carron Curie, CMA

## 2012-12-20 NOTE — Telephone Encounter (Signed)
Medication list received but there are no rescue inhalers on the list. Called for PA form at [phone]. Form received. We will fill this out today and have KC sign and fax back to the company for review of the Proventil HFA.

## 2012-12-21 MED ORDER — ALBUTEROL SULFATE HFA 108 (90 BASE) MCG/ACT IN AERS: 2.0000 | INHALATION_SPRAY | Freq: Four times a day (QID) | RESPIRATORY_TRACT | Status: DC | PRN

## 2012-12-21 NOTE — Telephone Encounter (Signed)
Correction.......Marland KitchenVentolin and Proair are the covered alternatives to the Proventil. We will send a new prescription to the pt's pharmacy for one of the covered alternatives.

## 2013-05-29 ENCOUNTER — Ambulatory Visit: Payer: BC Managed Care – PPO | Admitting: Pulmonary Disease

## 2013-07-04 ENCOUNTER — Ambulatory Visit (INDEPENDENT_AMBULATORY_CARE_PROVIDER_SITE_OTHER): Payer: BC Managed Care – PPO | Admitting: Pulmonary Disease

## 2013-07-04 ENCOUNTER — Encounter: Payer: Self-pay | Admitting: Pulmonary Disease

## 2013-07-04 VITALS — BP 140/86 | HR 74 | Temp 97.6°F | Ht 75.0 in | Wt 191.2 lb

## 2013-07-04 DIAGNOSIS — J438 Other emphysema: Secondary | ICD-10-CM

## 2013-07-04 NOTE — Assessment & Plan Note (Signed)
The patient has been fairly well controlled over the last one year, and continues to participate in a regular exercise program.  However, he feels that his exertional tolerance has declined since his last visit.  He is currently only on a long-acting anticholinergic, and I would like to add a LABA to this.  We'll try him on Anoro and see how he responds.  May have to add ICS as well.  Will monitory his progress.

## 2013-07-04 NOTE — Patient Instructions (Addendum)
Hold spiriva for now, and will try you on Anoro one inhalation each am.  Rinse mouth well.  Take everyday. Please call and give me some feedback in 3 weeks with how things are going.  Don't forget there are a lot of ways to treat your lung disease, and we typically take a stepwise approach. Continue your exercise program. followup with me in one year if doing well.

## 2013-07-04 NOTE — Progress Notes (Signed)
  Subjective:    Patient ID: Dale Woods, male    DOB: Aug 03, 1951, 62 y.o.   MRN: [id number]  HPI Patient comes in today for followup of his known COPD.  He has been staying very active, but does not feel that his exertional tolerance has been remaining at baseline of the last one year.  He's also had an episode of acute bronchitis this month which was treated with an antibiotic by his primary care physician.  He denies noncompliance with Spiriva, and his been using albuterol fairly regularly.   Review of Systems  Constitutional: Negative for fever and unexpected weight change.  HENT: Negative for ear pain, nosebleeds, congestion, sore throat, rhinorrhea, sneezing, trouble swallowing, dental problem, postnasal drip and sinus pressure.   Eyes: Negative for redness and itching.  Respiratory: Negative for cough, chest tightness, shortness of breath and wheezing.   Cardiovascular: Negative for palpitations and leg swelling.  Gastrointestinal: Negative for nausea and vomiting.  Genitourinary: Negative for dysuria.  Musculoskeletal: Negative for joint swelling.  Skin: Negative for rash.  Neurological: Negative for headaches.  Hematological: Does not bruise/bleed easily.  Psychiatric/Behavioral: Negative for dysphoric mood. The patient is not nervous/anxious.        Objective:   Physical Exam Thin male in no acute distress Nose without purulence or discharge noted Neck without lymphadenopathy or thyromegaly Chest totally clear auscultation, no wheezing Cardiac exam with regular rate and rhythm, no murmurs Lower extremities without edema, no cyanosis Alert and oriented, moves all 4 extremities.       Assessment & Plan:

## 2013-07-15 IMAGING — CR DG CHEST 2V
3 series · 3 of 3 positions shown · non-contrast
Comparison: 06/23/2010..

CLINICAL DATA: COPD.  .  Ex-smoker.

CHEST - 2 VIEW

[view not recorded (1 of 3)]
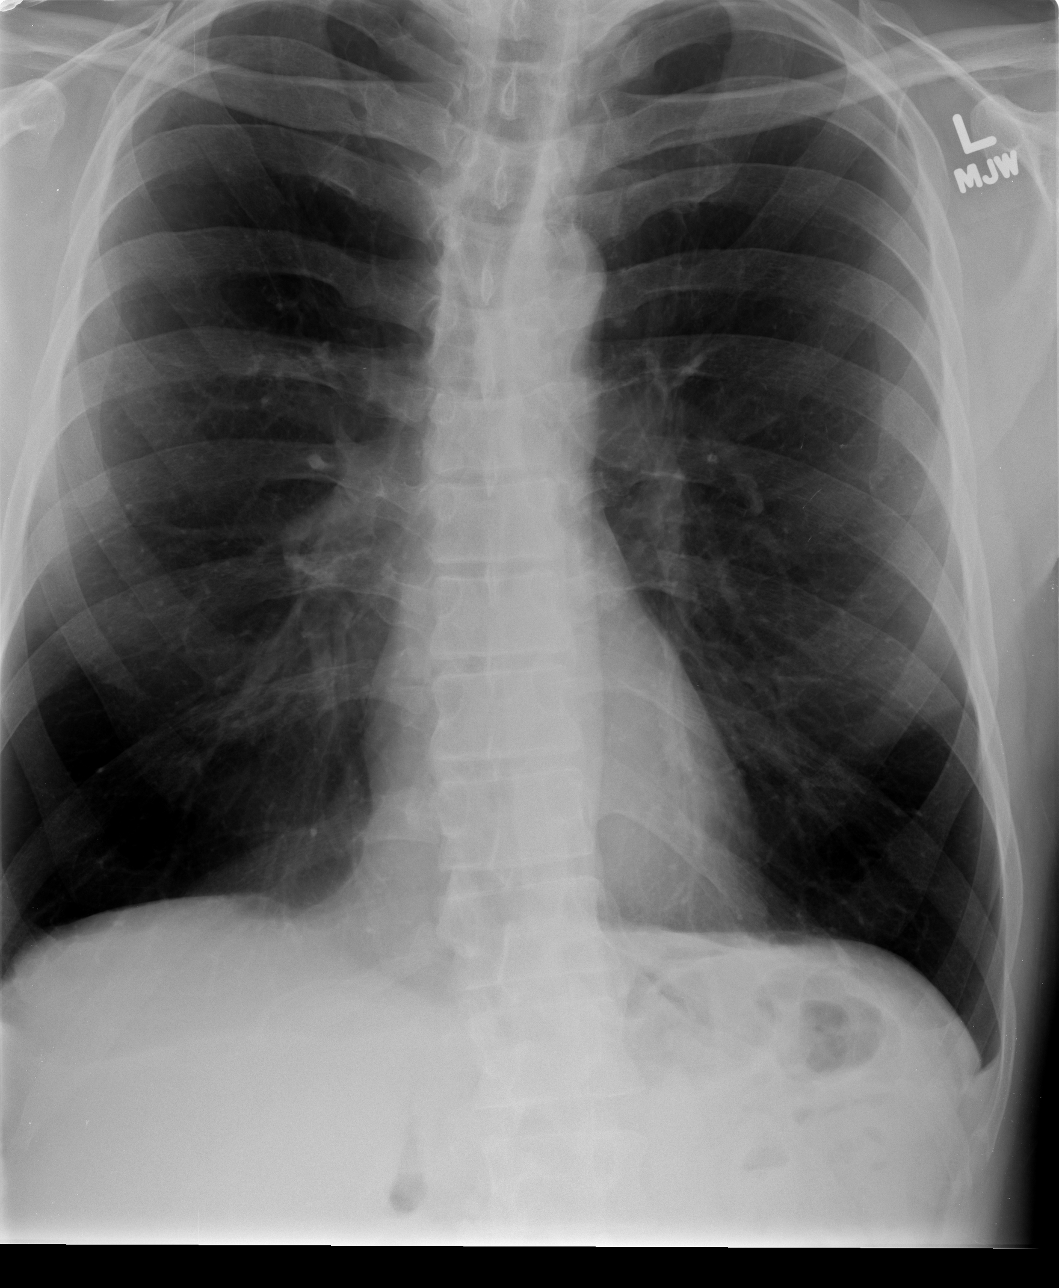

[view not recorded (2 of 3)]
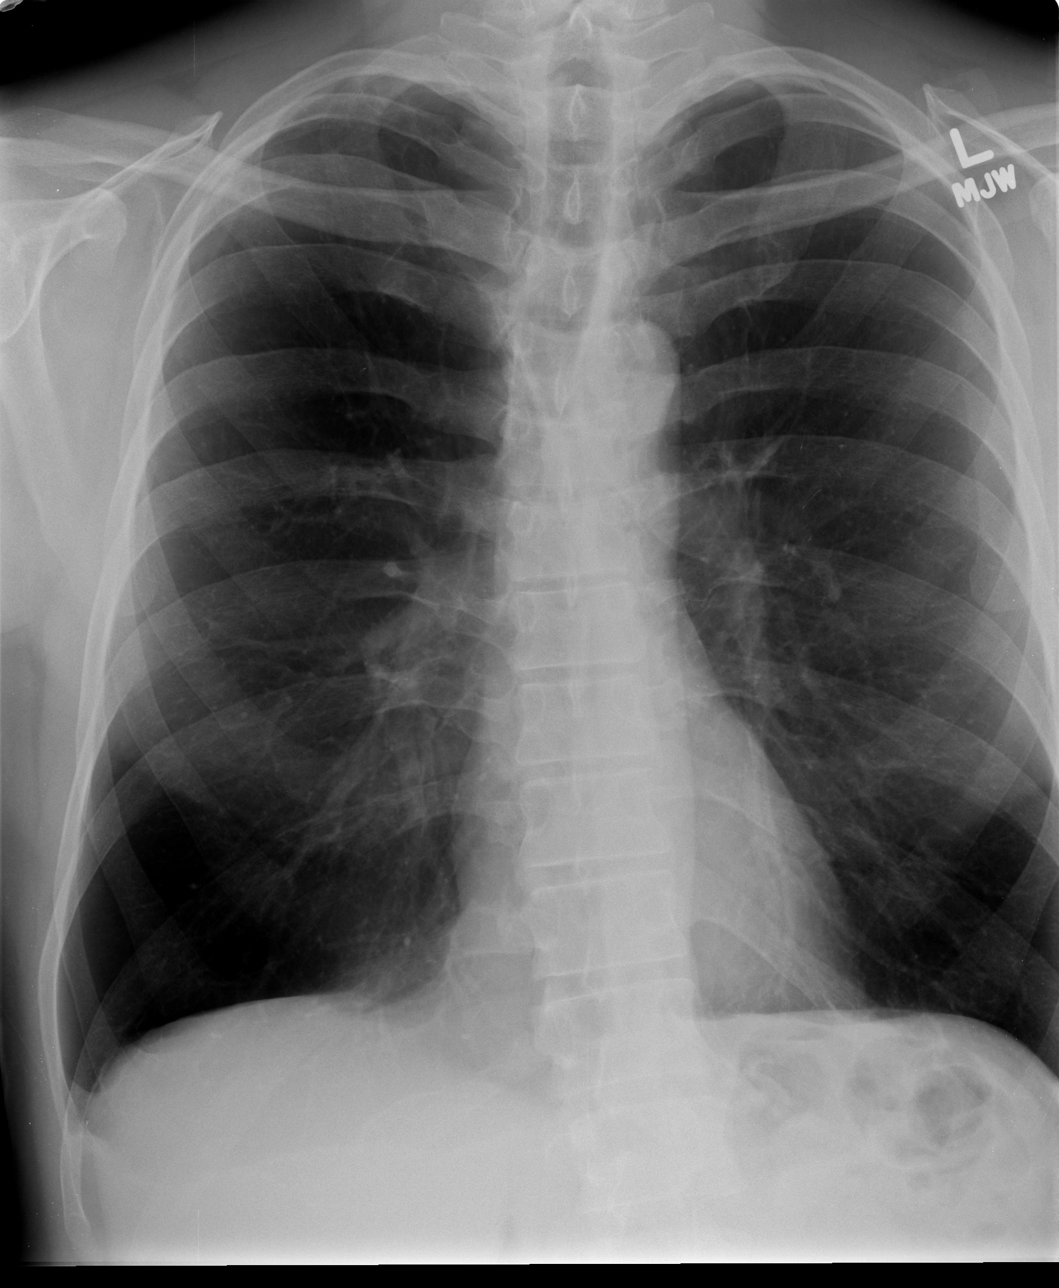

[view not recorded (3 of 3)]
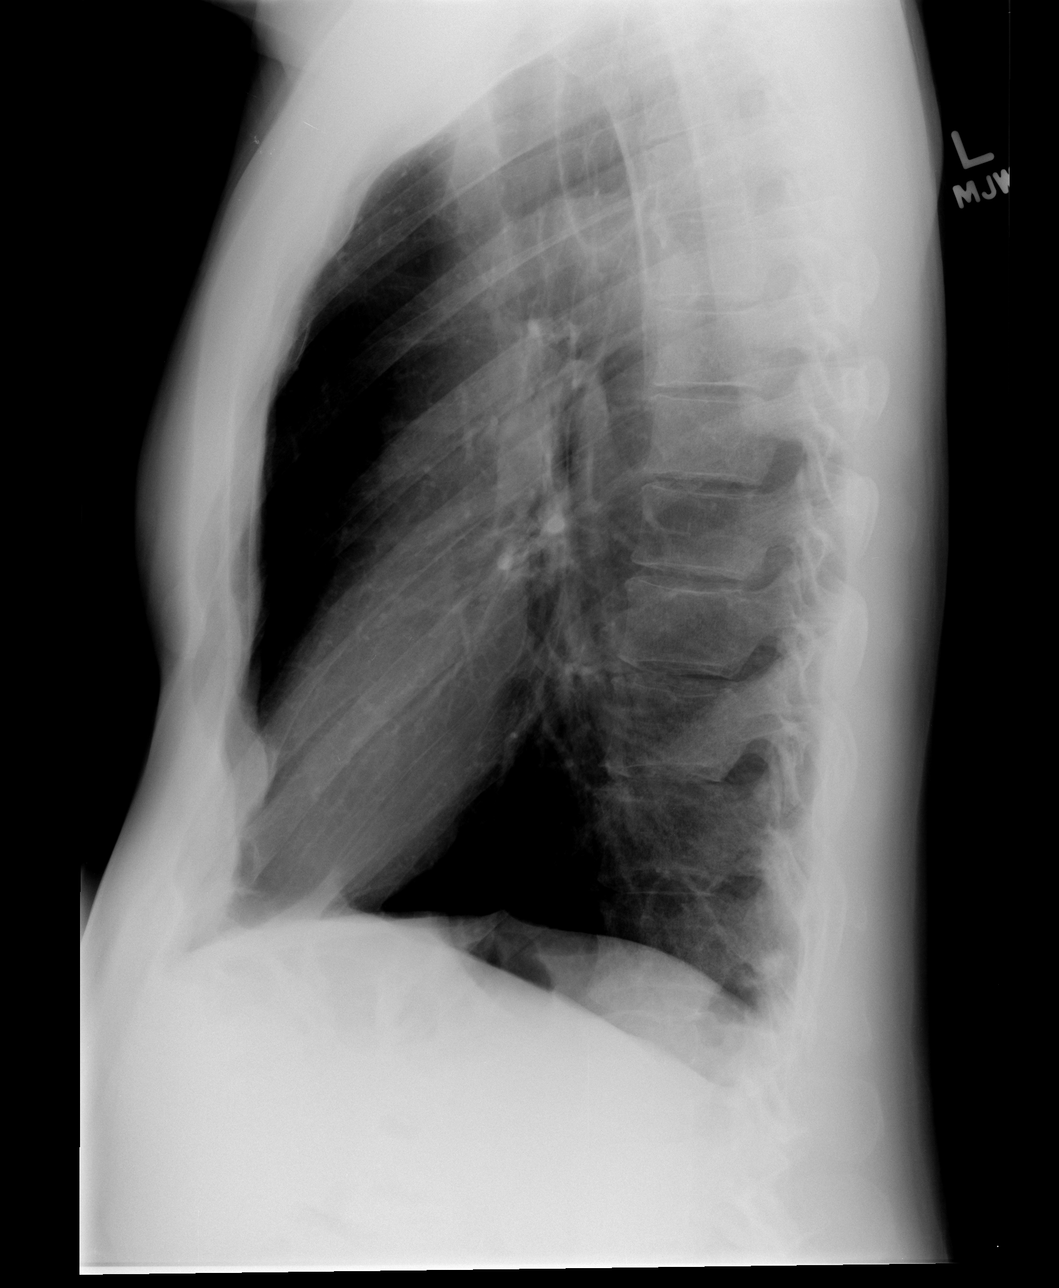

[3 of 3 positions shown; findings below may reference images not displayed]

FINDINGS: The cardiac silhouette is normal size and shape.  Aortic
ectasia is present.  Hilar and mediastinal contours appear stable.
There is generalized hyperinflation configuration with flattening
of diaphragm on lateral image consistent with COPD.  There are no
pulmonary infiltrates or nodules were evident. No pleural
abnormality is evident. Bones appear average for age.
IMPRESSION: Hyperinflation configuration consistent with COPD.  No acute
abnormality is evident.

## 2013-07-21 IMAGING — RF DG LUMBAR SPINE 2-3V
1 series · 4 of 4 positions shown · non-contrast
Comparison: Priors from Dr. [REDACTED].

CLINICAL DATA: Back pain

DG C-ARM GT 120 MIN,LUMBAR SPINE - 2-3 VIEW

[Series 1: run · 4 of 4 slices shown]
[im 1/4]
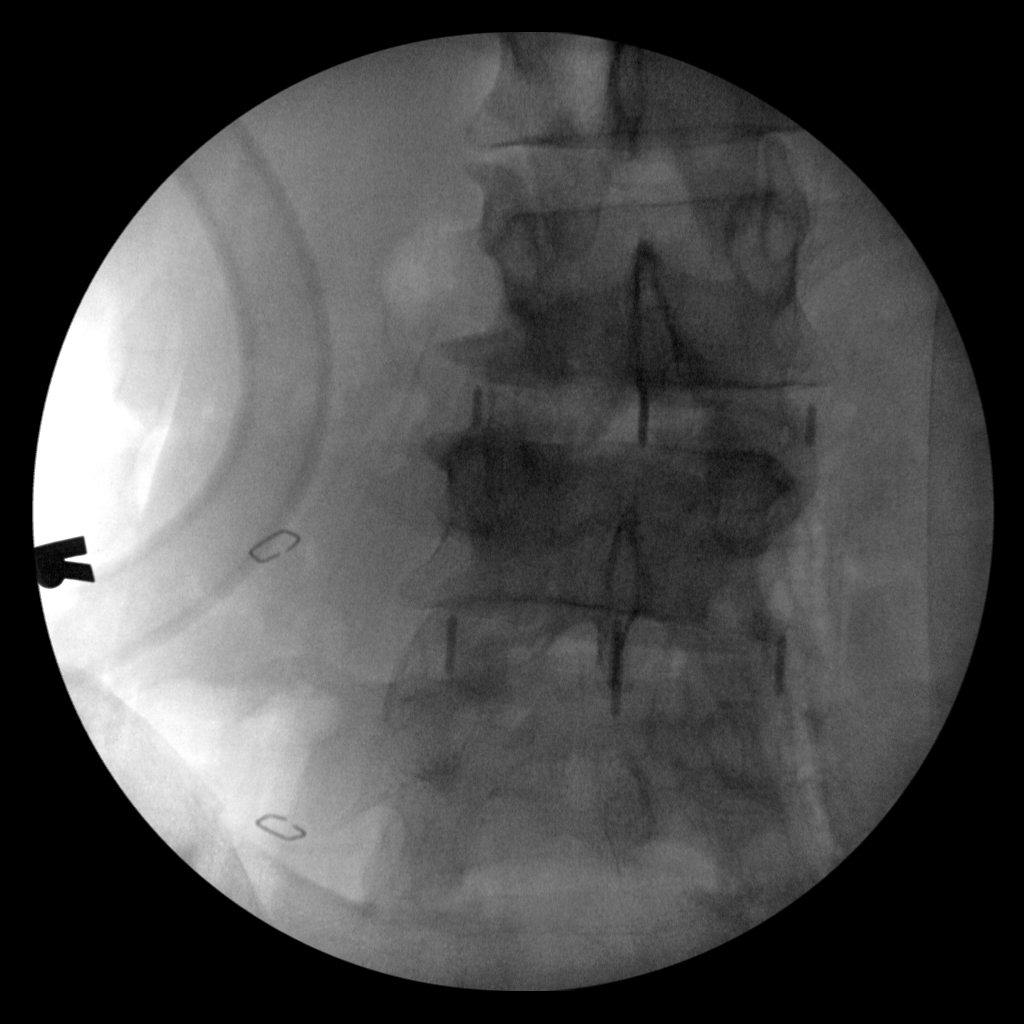
[im 2/4]
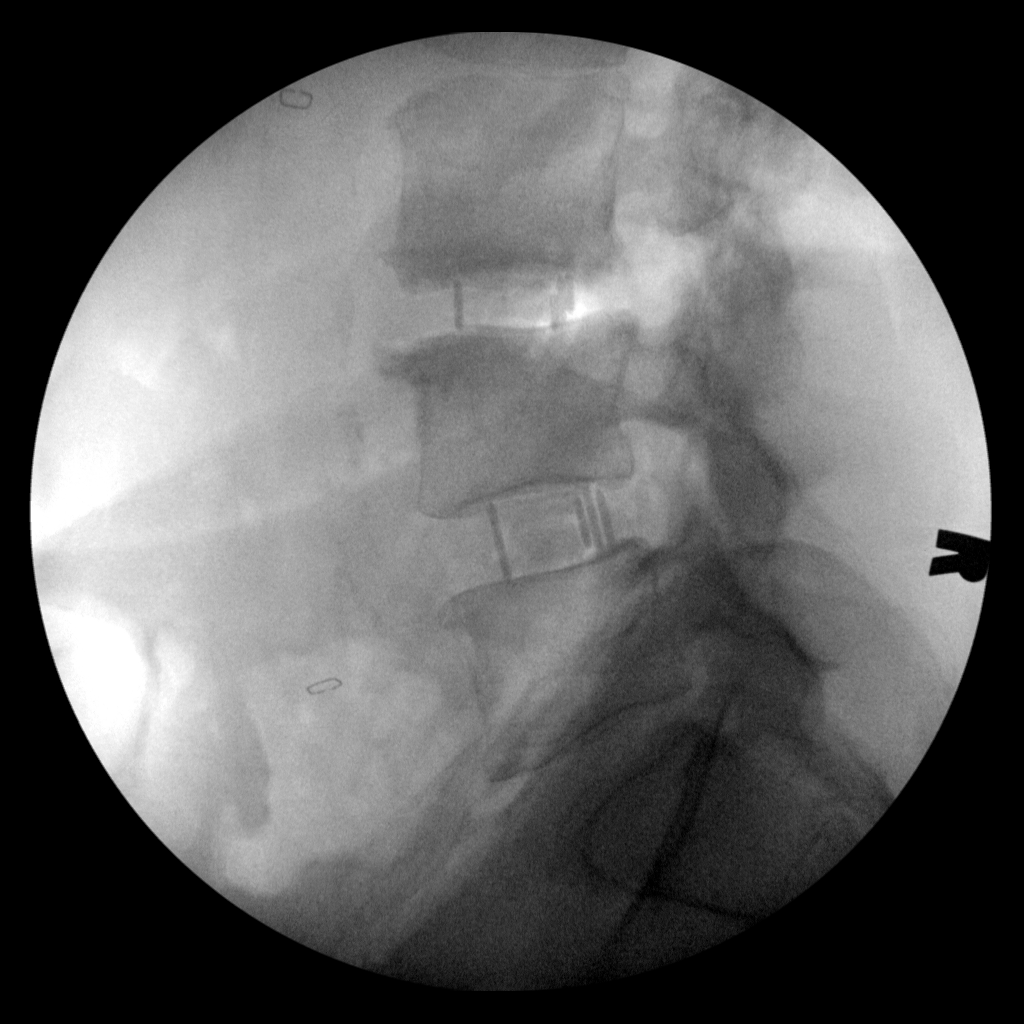
[im 3/4]
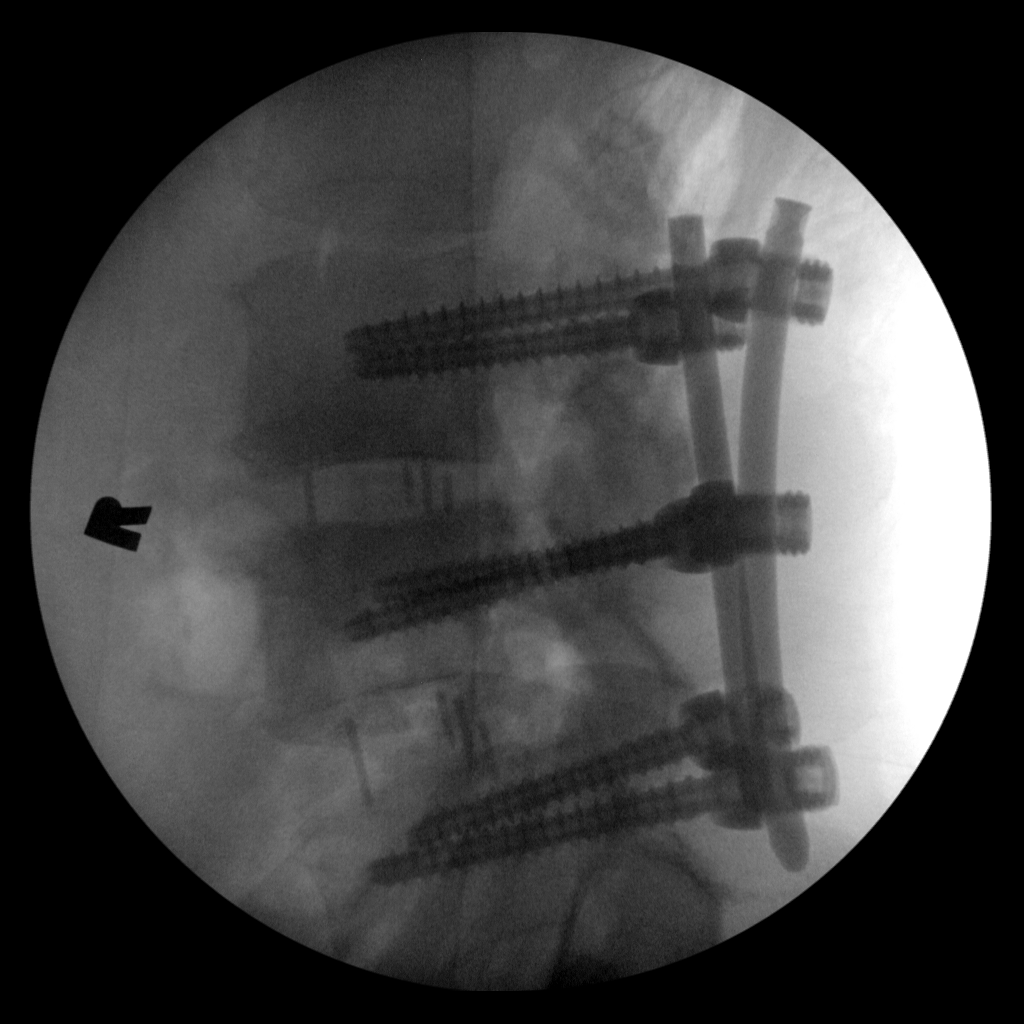
[im 4/4]
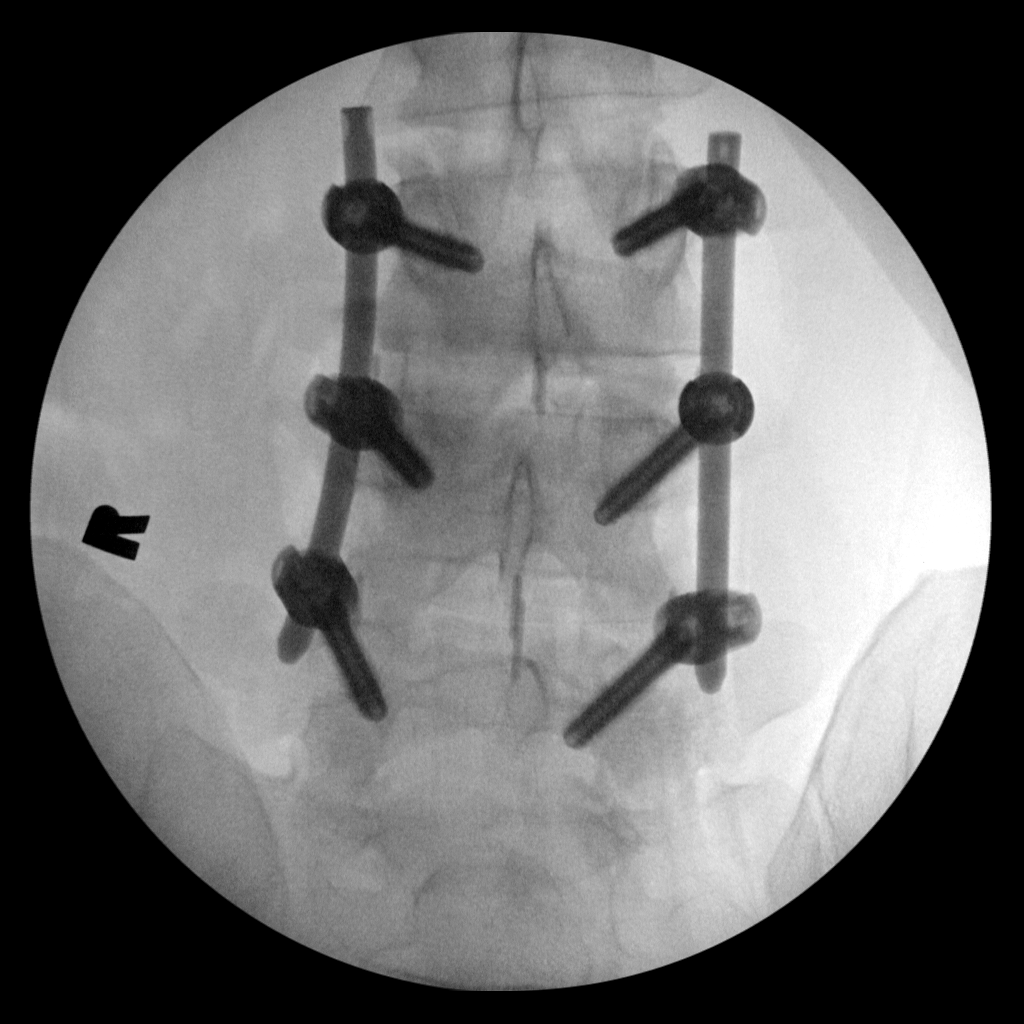

[4 of 4 positions shown; findings below may reference images not displayed]

FINDINGS: C-arm films document L3-L5 interbody and posterolateral
XLIF fusion.  Satisfactory position and alignment.
IMPRESSION: As above.

## 2013-07-28 ENCOUNTER — Telehealth: Payer: Self-pay | Admitting: Pulmonary Disease

## 2013-07-28 NOTE — Telephone Encounter (Signed)
lmomtcb x1 for pt 

## 2013-08-02 NOTE — Telephone Encounter (Signed)
ATC patient, no answer LMOMTCB 

## 2013-08-03 NOTE — Telephone Encounter (Signed)
Spoke with patient Patient states he liked sampling the Anoro-- says it wasn't a complete game changer but he tolerated well and says he thinks his breathing may have been better as the day progressed Patient states he ran out of the sample and had lots of symbicort so switched back to that Patient states he would be will to go on Anoro as long as it was not too expensive with his insurance Dr. Shelle Iron please advise, thank you  Last OV 07/04/13 Next OV 07/10/14

## 2013-08-03 NOTE — Telephone Encounter (Signed)
ATC patient no answer LMOMTCB 

## 2013-08-04 MED ORDER — UMECLIDINIUM-VILANTEROL 62.5-25 MCG/INH IN AEPB: 1.0000 | INHALATION_SPRAY | Freq: Every day | RESPIRATORY_TRACT | Status: DC

## 2013-08-04 NOTE — Telephone Encounter (Signed)
When I saw him, he was on spiriva alone, not symbicort?? If the anoro helped, I would like for him to stay on this.  He needs to check with insurance to see how much, and we can send a script for him.

## 2013-08-04 NOTE — Telephone Encounter (Signed)
LMOM x 1 Anoro #30 1 qd -- refill sent to pharmacy since patient did not answer phone--if patient does not want the medication then he can choose not to fill it. I did not want the patient to go without medications this weekend. Patient instructed to call our office back Monday morning.

## 2013-09-22 ENCOUNTER — Telehealth: Payer: Self-pay | Admitting: Pulmonary Disease

## 2013-09-22 MED ORDER — ALBUTEROL SULFATE HFA 108 (90 BASE) MCG/ACT IN AERS: 2.0000 | INHALATION_SPRAY | Freq: Four times a day (QID) | RESPIRATORY_TRACT | Status: DC | PRN

## 2013-09-22 NOTE — Telephone Encounter (Signed)
I spoke with the pt and he states that he needs a refill on proair and also that  His anoro needs a prior authorization. I advised refill sent and I will initiate the PA. I called the pharmacy and the PA number is [phone]. I called and could not speak to a person, could only request a fax. Fax requested to triage fax. Will await fax. Carron Curie, CMA

## 2013-09-22 NOTE — Telephone Encounter (Signed)
lmomtcb x1 for pt 

## 2013-09-25 NOTE — Telephone Encounter (Signed)
done

## 2013-09-25 NOTE — Telephone Encounter (Signed)
Faxed to [phone] Put in scan folder.

## 2013-09-25 NOTE — Telephone Encounter (Signed)
Form received and completed and given to Dr. Shelle Iron to sign. Need to fax form once signed. Carron Curie, CMA

## 2013-11-08 ENCOUNTER — Ambulatory Visit (INDEPENDENT_AMBULATORY_CARE_PROVIDER_SITE_OTHER): Payer: BC Managed Care – PPO | Admitting: Internal Medicine

## 2013-11-08 ENCOUNTER — Encounter: Payer: Self-pay | Admitting: Internal Medicine

## 2013-11-08 VITALS — BP 140/88 | HR 67 | Ht 74.0 in | Wt 189.2 lb

## 2013-11-08 DIAGNOSIS — J441 Chronic obstructive pulmonary disease with (acute) exacerbation: Secondary | ICD-10-CM | POA: Insufficient documentation

## 2013-11-08 MED ORDER — PREDNISONE 10 MG PO TABS: ORAL_TABLET | ORAL | Status: DC

## 2013-11-08 MED ORDER — TIOTROPIUM BROMIDE MONOHYDRATE 18 MCG IN CAPS: 18.0000 ug | ORAL_CAPSULE | Freq: Every day | RESPIRATORY_TRACT | Status: DC

## 2013-11-08 MED ORDER — AZITHROMYCIN 250 MG PO TABS: ORAL_TABLET | ORAL | Status: DC

## 2013-11-08 NOTE — Assessment & Plan Note (Signed)
Mild Flare up of COPD related to change of maintenance copd med or virus STop ANORO.   Restart spiriva 1 puff once daily; take refills Please take prednisone 40 mg x1 day, then 30 mg x1 day, then 20 mg x1 day, then 10 mg x1 day, and then 5 mg x1 day and stop Please take Z Pak If symptoms do not resolve, consider coming off Fish oil  Followup  - with Dr Clance as before 

## 2013-11-08 NOTE — Progress Notes (Signed)
Subjective:    Patient ID: Dale Woods, male    DOB: [birth year], 62 y.o.   MRN: [id number]  HPI OV 11/08/2013 - ACUTE. DR Shelle Iron patient seeing MR  Chief Complaint  Patient presents with  . Acute Visit    Pt c/o dry cough x weeks. Pt states he is alos having some increased SOB as well.    Known emphysema.  reports that he quit smoking about 7 years ago. His smoking use included Cigarettes. He has a 74 pack-year smoking history. He does not have any smokeless tobacco history on file.  Some month ago or so has been trying ANORO instead of spiriva. Says since the change been noticing increased chest tightness, wheeze, cough from the chest, and mild increase in dyspnea. No change in sputum volume but feels there is a change in sputum taste. Denies fever, hemoptysis, edema, night sweats, orthopnea    Review of Systems  Constitutional: Negative for fever and unexpected weight change.  HENT: Negative for congestion, dental problem, ear pain, nosebleeds, postnasal drip, rhinorrhea, sinus pressure, sneezing, sore throat and trouble swallowing.   Eyes: Negative for redness and itching.  Respiratory: Positive for cough and shortness of breath. Negative for chest tightness and wheezing.   Cardiovascular: Negative for palpitations and leg swelling.  Gastrointestinal: Negative for nausea and vomiting.  Genitourinary: Negative for dysuria.  Musculoskeletal: Negative for joint swelling.  Skin: Negative for rash.  Neurological: Negative for headaches.  Hematological: Does not bruise/bleed easily.  Psychiatric/Behavioral: Negative for dysphoric mood. The patient is not nervous/anxious.    Current outpatient prescriptions:acetaminophen (TYLENOL) 325 MG tablet, Take 650 mg by mouth every 6 (six) hours as needed. pain, Disp: , Rfl: ;  albuterol (PROAIR HFA) 108 (90 BASE) MCG/ACT inhaler, Inhale 2 puffs into the lungs every 6 (six) hours as needed for wheezing., Disp: 3 Inhaler, Rfl: 1;   Cholecalciferol (VITAMIN D) 1000 UNITS capsule, Take 1,000 Units by mouth daily.  , Disp: , Rfl:  naproxen (NAPROSYN) 500 MG tablet, Take 500 mg by mouth 2 (two) times daily as needed. Pain, Disp: , Rfl: ;  Omega-3 Fatty Acids (ULTRA OMEGA-3 FISH OIL) 1400 MG CAPS, Take 1 tablet by mouth daily.  , Disp: , Rfl: ;  tiotropium (SPIRIVA) 18 MCG inhalation capsule, Place 18 mcg into inhaler and inhale daily., Disp: , Rfl: ;  Umeclidinium-Vilanterol (ANORO ELLIPTA) 62.5-25 MCG/INH AEPB, Inhale 1 puff into the lungs daily., Disp: 30 each, Rfl: 6     Objective:   Physical Exam  Nursing note and vitals reviewed. Constitutional: He is oriented to person, place, and time. He appears well-developed and well-nourished. No distress.  HENT:  Head: Normocephalic and atraumatic.  Right Ear: External ear normal.  Left Ear: External ear normal.  Mouth/Throat: Oropharynx is clear and moist. No oropharyngeal exudate.  Eyes: Conjunctivae and EOM are normal. Pupils are equal, round, and reactive to light. Right eye exhibits no discharge. Left eye exhibits no discharge. No scleral icterus.  Neck: Normal range of motion. Neck supple. No JVD present. No tracheal deviation present. No thyromegaly present.  Cardiovascular: Normal rate, regular rhythm and intact distal pulses.  Exam reveals no gallop and no friction rub.   No murmur heard. Pulmonary/Chest: Effort normal and breath sounds normal. No respiratory distress. He has no wheezes. He has no rales. He exhibits no tenderness.  Abdominal: Soft. Bowel sounds are normal. He exhibits no distension and no mass. There is no tenderness. There is no rebound and no guarding.  Musculoskeletal: Normal range of motion. He exhibits no edema and no tenderness.  Lymphadenopathy:    He has no cervical adenopathy.  Neurological: He is alert and oriented to person, place, and time. He has normal reflexes. No cranial nerve deficit. Coordination normal.  Skin: Skin is warm and dry. No  rash noted. He is not diaphoretic. No erythema. No pallor.  Psychiatric: He has a normal mood and affect. His behavior is normal. Judgment and thought content normal.          Assessment & Plan:

## 2013-11-08 NOTE — Patient Instructions (Addendum)
Mild Flare up of COPD related to change of maintenance copd med or virus STop ANORO.   Restart spiriva 1 puff once daily; take refills Please take prednisone 40 mg x1 day, then 30 mg x1 day, then 20 mg x1 day, then 10 mg x1 day, and then 5 mg x1 day and stop Please take Z Pak If symptoms do not resolve, consider coming off Fish oil  Followup  - with Dr Shelle Iron as before

## 2014-06-23 ENCOUNTER — Other Ambulatory Visit: Payer: Self-pay | Admitting: Internal Medicine

## 2014-07-09 ENCOUNTER — Ambulatory Visit (INDEPENDENT_AMBULATORY_CARE_PROVIDER_SITE_OTHER): Payer: BC Managed Care – PPO | Admitting: Pulmonary Disease

## 2014-07-09 ENCOUNTER — Encounter: Payer: Self-pay | Admitting: Pulmonary Disease

## 2014-07-09 ENCOUNTER — Ambulatory Visit (INDEPENDENT_AMBULATORY_CARE_PROVIDER_SITE_OTHER)
Admission: RE | Admit: 2014-07-09 | Discharge: 2014-07-09 | Disposition: A | Discharge status: Home / Self Care | Payer: BC Managed Care – PPO | Source: Ambulatory Visit | Attending: Pulmonary Disease | Admitting: Pulmonary Disease

## 2014-07-09 VITALS — BP 130/84 | HR 88 | Temp 98.0°F | Ht 74.0 in | Wt 190.2 lb

## 2014-07-09 DIAGNOSIS — J439 Emphysema, unspecified: Secondary | ICD-10-CM

## 2014-07-09 DIAGNOSIS — J438 Other emphysema: Secondary | ICD-10-CM

## 2014-07-09 NOTE — Progress Notes (Signed)
   Subjective:    Patient ID: Dale Woods, male    DOB: [birth year], 63 y.o.   MRN: [id number]  HPI The patient comes in today for followup of his known COPD. He was tried on anoro at the last visit, and felt like it made his breathing worse. He has stayed on Spiriva, and although he is doing fairly well, he is not satisfied with his exertional tolerance. He has had no significant cough, mucus production, or acute exacerbation.   Review of Systems  Constitutional: Negative for fever and unexpected weight change.  HENT: Negative for congestion, dental problem, ear pain, nosebleeds, postnasal drip, rhinorrhea, sinus pressure, sneezing, sore throat and trouble swallowing.   Eyes: Negative for redness and itching.  Respiratory: Negative for cough, chest tightness, shortness of breath and wheezing.   Cardiovascular: Negative for palpitations and leg swelling.  Gastrointestinal: Negative for nausea and vomiting.  Genitourinary: Negative for dysuria.  Musculoskeletal: Negative for joint swelling.  Skin: Negative for rash.  Neurological: Negative for headaches.  Hematological: Does not bruise/bleed easily.  Psychiatric/Behavioral: Negative for dysphoric mood. The patient is not nervous/anxious.        Objective:   Physical Exam Thin male in no acute distress Nose without purulence or discharge noted Neck without lymphadenopathy or thyromegaly Chest with mildly decreased breath sounds, no wheezing Cardiac exam with regular rate and rhythm Lower extremities without edema, no cyanosis Alert and oriented, moves all 4 extremities.       Assessment & Plan:

## 2014-07-09 NOTE — Assessment & Plan Note (Signed)
The patient is doing fairly well overall, but is not satisfied with his exertional tolerance. He did not do well on anoro, and I suspect because it's a dry powder. I would like to try him on stiolto and see how he responds. He is interested in a chest x-ray today to screen for lung cancer, and I have discussed with him at great length the low dose CT screening program. However, he cannot afford to $299 cost. He will think about this going forward.

## 2014-07-09 NOTE — Patient Instructions (Signed)
Hold spiriva, and start stiolto 2 inhalations each am everyday.   Give me some feedback at the end of the 4 week trial. Will check chest xray today, and call you with the results.  Think about the lung cancer screening program as we discussed.  followup with me again in one year if doing well.

## 2014-07-11 NOTE — Progress Notes (Signed)
Quick Note:  Patient returned call. Advised of cxr results as stated by [facility]. Pt verbalized understanding and denied any questions. ______

## 2014-08-06 ENCOUNTER — Telehealth: Payer: Self-pay | Admitting: Pulmonary Disease

## 2014-08-06 MED ORDER — TIOTROPIUM BROMIDE-OLODATEROL 2.5-2.5 MCG/ACT IN AERS: 2.0000 | INHALATION_SPRAY | Freq: Every day | RESPIRATORY_TRACT | Status: DC

## 2014-08-06 NOTE — Telephone Encounter (Signed)
Per 07/09/14 OV; Patient Instructions      Hold spiriva, and start stiolto 2 inhalations each am everyday.   Give me some feedback at the end of the 4 week trial. Will check chest xray today, and call you with the results.  Think about the lung cancer screening program as we discussed.   followup with me again in one year if doing well  --  I called spoke with pt. He reports he started stiolto. He reports he really likesthis medication and has shown improvement. He wants to stay on this if insurance will cover. He asks for RX to be sent to the pharm. I have done so and will send to [facility] as an [place]

## 2014-08-27 ENCOUNTER — Telehealth: Payer: Self-pay | Admitting: Pulmonary Disease

## 2014-08-27 MED ORDER — TIOTROPIUM BROMIDE-OLODATEROL 2.5-2.5 MCG/ACT IN AERS: 2.0000 | INHALATION_SPRAY | Freq: Every day | RESPIRATORY_TRACT | Status: DC

## 2014-08-27 NOTE — Telephone Encounter (Signed)
Pt called stating he needs another sample of Stiolto while we are working on his PA for this. Pt is aware I have left a sample at the front desk and PA is on my desk to complete. Will hold message in my in basket until PA started.

## 2014-09-05 NOTE — Telephone Encounter (Signed)
Katie, please advise on status.

## 2014-09-06 NOTE — Telephone Encounter (Signed)
i called [phone] and requested that a form be faxed to the triage fax machine.   PT ID# [id number]. Waiting on fax.  Will hold this message in triage.

## 2014-09-07 NOTE — Telephone Encounter (Signed)
As of last night, no form received

## 2014-09-07 NOTE — Telephone Encounter (Signed)
Mindy, did you every receive a PA form for this pt? Please advise, thanks!

## 2014-09-07 NOTE — Telephone Encounter (Signed)
Form received, completed, and placed in [facility] look-at to sign. Carron CurieJennifer Castillo, CMA

## 2014-09-07 NOTE — Telephone Encounter (Signed)
[facility] up on per her request.

## 2014-09-10 NOTE — Telephone Encounter (Signed)
Form has been faxed back. Will keep in my box

## 2014-09-10 NOTE — Telephone Encounter (Signed)
Form was just faxed back this AM. It can take 24-72 hrs for a response.

## 2014-09-10 NOTE — Telephone Encounter (Signed)
Pt is calling stating that he has now ran out of stiolto samples again while awaiting the PA.  Would like an update & more samples, please.  Antionette FairyHolly D Pryor

## 2014-09-10 NOTE — Telephone Encounter (Signed)
Spoke with pt, let him know that I have a sample of stiolto up front for him. Mindy please advise on PA status.  Thanks!

## 2014-09-14 NOTE — Telephone Encounter (Signed)
Called to check on the PA for the stiolto---they are needing [facility]  Will forward to Mindy and [place] to make them aware.  Please advise. thanks

## 2014-09-17 NOTE — Telephone Encounter (Signed)
done

## 2014-09-18 NOTE — Telephone Encounter (Signed)
Letter of Med Necessity faxed to [phone].  Will forward back to Mindy to await response.  They stated we should hear back in less than 24 hrs.

## 2014-09-20 NOTE — Telephone Encounter (Signed)
ATC to check on status and was on hold x 25 min and never received a pharmacists once transferred. WCB

## 2014-09-21 NOTE — Telephone Encounter (Signed)
Dale Woods calling back stating that medication has been approved beginning 09/20/14 ending 09/20/15 and that pharm needs to resubmitt claim she can be reached @ [phone].Dale Woods

## 2014-09-21 NOTE — Telephone Encounter (Signed)
Pharmacy aware med approved. Carron CurieJennifer Castillo, CMA

## 2014-09-21 NOTE — Telephone Encounter (Signed)
Called and spoke with Dale Woods and she stated that the pharmacist is on a conference call and once he is done she will have him review this case and will call me back once this has been done.

## 2014-09-24 ENCOUNTER — Telehealth: Payer: Self-pay | Admitting: Pulmonary Disease

## 2014-09-24 MED ORDER — TIOTROPIUM BROMIDE-OLODATEROL 2.5-2.5 MCG/ACT IN AERS: 2.0000 | INHALATION_SPRAY | Freq: Every day | RESPIRATORY_TRACT | Status: DC

## 2014-09-24 NOTE — Telephone Encounter (Signed)
Called pt and is aware RX has been sent in. Nothing further needed 

## 2014-10-17 ENCOUNTER — Telehealth: Payer: Self-pay | Admitting: Pulmonary Disease

## 2014-10-17 MED ORDER — AZITHROMYCIN 250 MG PO TABS: ORAL_TABLET | ORAL | Status: DC

## 2014-10-17 MED ORDER — PREDNISONE 10 MG PO TABS: ORAL_TABLET | ORAL | Status: DC

## 2014-10-17 NOTE — Telephone Encounter (Signed)
Ok to send in zpak and 8 day prednisone taper.

## 2014-10-17 NOTE — Telephone Encounter (Signed)
Called and spoke to pt. Pt c/o cough with little mucus production, labored breathing with activity, burning in chest when coughing, sore throat and chills- pt does not have thermometer, unsure if febrile. Pt denied angina. Pt taking mucinex and tylenol. Pt stated he had a cold in November with similar s/s and was given pred taper and zpak by PCP. [facility]   [facility] please advise.  Allergies  Allergen Reactions  . Penicillins Anaphylaxis

## 2014-10-17 NOTE — Telephone Encounter (Signed)
Called pt. Aware of recs. RX's sent in.

## 2015-04-25 ENCOUNTER — Other Ambulatory Visit: Payer: Self-pay | Admitting: Neurosurgery

## 2015-06-12 ENCOUNTER — Other Ambulatory Visit (HOSPITAL_COMMUNITY): Payer: Self-pay

## 2015-06-17 ENCOUNTER — Encounter (HOSPITAL_COMMUNITY): Payer: Self-pay

## 2015-06-17 ENCOUNTER — Encounter (HOSPITAL_COMMUNITY)
Admission: RE | Admit: 2015-06-17 | Discharge: 2015-06-17 | Disposition: A | Discharge status: Home / Self Care | Payer: BLUE CROSS/BLUE SHIELD | Source: Ambulatory Visit | Attending: Neurosurgery | Admitting: Neurosurgery

## 2015-06-17 ENCOUNTER — Other Ambulatory Visit: Payer: Self-pay | Admitting: Neurosurgery

## 2015-06-17 ENCOUNTER — Ambulatory Visit (HOSPITAL_COMMUNITY)
Admission: RE | Admit: 2015-06-17 | Discharge: 2015-06-17 | Disposition: A | Discharge status: Home / Self Care | Payer: BLUE CROSS/BLUE SHIELD | Source: Ambulatory Visit | Attending: Anesthesiology | Admitting: Anesthesiology

## 2015-06-17 DIAGNOSIS — Z01818 Encounter for other preprocedural examination: Secondary | ICD-10-CM | POA: Insufficient documentation

## 2015-06-17 DIAGNOSIS — Z01811 Encounter for preprocedural respiratory examination: Secondary | ICD-10-CM

## 2015-06-17 DIAGNOSIS — Z01812 Encounter for preprocedural laboratory examination: Secondary | ICD-10-CM | POA: Insufficient documentation

## 2015-06-17 DIAGNOSIS — J449 Chronic obstructive pulmonary disease, unspecified: Secondary | ICD-10-CM | POA: Diagnosis not present

## 2015-06-17 DIAGNOSIS — Z0183 Encounter for blood typing: Secondary | ICD-10-CM | POA: Diagnosis not present

## 2015-06-17 HISTORY — DX: Reserved for inherently not codable concepts without codable children: IMO0001

## 2015-06-17 HISTORY — DX: Family history of other specified conditions: Z84.89

## 2015-06-17 HISTORY — DX: Anxiety disorder, unspecified: F41.9

## 2015-06-17 LAB — TYPE AND SCREEN
ABO/RH(D): A NEG
ANTIBODY SCREEN: NEGATIVE

## 2015-06-17 LAB — CBC
HCT: 45.2 % (ref 39.0–52.0)
HEMOGLOBIN: 15.4 g/dL (ref 13.0–17.0)
MCH: 31.6 pg (ref 26.0–34.0)
MCHC: 34.1 g/dL (ref 30.0–36.0)
MCV: 92.8 fL (ref 78.0–100.0)
Platelets: 189 10*3/uL (ref 150–400)
RBC: 4.87 MIL/uL (ref 4.22–5.81)
RDW: 13.3 % (ref 11.5–15.5)
WBC: 5.8 10*3/uL (ref 4.0–10.5)

## 2015-06-17 LAB — SURGICAL PCR SCREEN
MRSA, PCR: NEGATIVE
STAPHYLOCOCCUS AUREUS: POSITIVE — AB

## 2015-06-17 LAB — BASIC METABOLIC PANEL
ANION GAP: 7 (ref 5–15)
BUN: 14 mg/dL (ref 6–20)
CALCIUM: 9.7 mg/dL (ref 8.9–10.3)
CHLORIDE: 104 mmol/L (ref 101–111)
CO2: 28 mmol/L (ref 22–32)
CREATININE: 1.14 mg/dL (ref 0.61–1.24)
GFR calc Af Amer: >60 mL/min (ref >60)
GFR calc non Af Amer: >60 mL/min (ref >60)
Glucose, Bld: 94 mg/dL (ref 65–99)
POTASSIUM: 4.3 mmol/L (ref 3.5–5.1)
Sodium: 139 mmol/L (ref 135–145)

## 2015-06-17 NOTE — Pre-Procedure Instructions (Signed)
    Dale Woods  06/17/2015     Your procedure is scheduled on Friday, June 21, 2015  Report to [facility] Admitting at 5:30 A.M. ( per MD)               Your surgery is scheduled for 8:00AM   Call this number if you have problems the morning of surgery: [phone]                 For any other questions, please call [phone], Monday - Friday 8 AM - 4 PM.   Remember:  Do not eat food or drink liquids after midnight Thursday, June 20, 2015  Take these medicines the morning of surgery with A SIP OF WATER :  May XBM:[id number] Bromide-Olodaterol (STIOLTO RESPIMAT.)                   If needed: traMADol Janean Sark) OR acetaminophen (TYLENOL) for pain, albuterol (PROAIR HFA)  inhaler for wheezing ( bring inhaler in with you on day of procedure)  Stop taking Aspirin, vitamins and herbal medications. Do not take any NSAIDs ie: Ibuprofen, Advil, Naproxen or any medication containing Aspirin; stop now.  Do not wear jewelry, make-up or nail polish.  Do not wear lotions, powders, or perfumes.  You may not wear deodorant.   Men may shave face and neck.  Do not bring valuables to the hospital.  [facility] is not responsible for any belongings or valuables.  Contacts, dentures or bridgework may not be worn into surgery.  Leave your suitcase in the car.  After surgery it may be brought to your room.  For patients admitted to the hospital, discharge time will be determined by your treatment team.  Special instructions: Review  [place] - Preparing For Surgery.  Please read over the following fact sheets that you were given. Pain Booklet, Coughing and Deep Breathing, Blood Transfusion Information, MRSA Information and Surgical Site Infection Prevention

## 2015-06-17 NOTE — Progress Notes (Signed)
Dale Woods states that he started coughing the past couple of days and had a sore throat.  Tempeture is 98.7.  Patient has  History of COPD, chest x-ray done today.

## 2015-06-18 NOTE — H&P (Signed)
Patient ID:   [id number] Patient: Dale Woods  Date of Birth: Jul 23, 1951 Visit Type: Office Visit   Date: 04/24/2015 01:00 PM Provider: Danae Orleans. Venetia Maxon MD   This 64 year old male presents for back pain and Leg pain.  History of Present Illness: 1.  back pain  2.  Leg pain  On discussion with the patient he recalls a sudden pain in his back and left leg after moving a mattress approximate year after his surgery.  He now says that the pain is becoming much more intense and is about 8-9 out of 10 in severity.  He notes pain into his left SI joint.  On examination today he has weakness in his left leg and EHL at 4 minus out of 5 in left dorsiflexion at 4-5 as well as left hip abductors at 4-5.  The patient's MRI was reviewed and this shows scoliosis with intrapedicular distance on the left at the between the L5 and S1 pedicles of 16 mm impaired to 23 mm on the right.  There is also a severe degree of L5 nerve root compression within the foramen due to a foraminal and extraforaminal disc herniation which is causing significant compression and distortion of the L5 nerve root.  He has some mild degenerative changes at the L2-3 level but this does not appear to be severe.  Based on the significant weakness of the patient has on examination as well as marked nerve root compression I recommended that he undergo additional surgery.  This will be to decompress the L5 nerve root at the L5-S1 level with exploration of prior fusion and extension of fusion to the L5-S1 level.  Currently the patient is complaining of increasing pain along the left thigh and calf along with numbness into his left foot.  He wants to continue with usage of tramadol but says that the pain is quite severe.  He wishes to go ahead with surgery in early August.      Medical/Surgical/Interim History Reviewed, no change.  Last detailed document date:01/14/2015.   PAST MEDICAL HISTORY, SURGICAL HISTORY, FAMILY HISTORY, SOCIAL  HISTORY AND REVIEW OF SYSTEMS I have reviewed the patient's past medical, surgical, family and social history as well as the comprehensive review of systems as included on the [facility] history form dated 12/24/2014, which I have signed.  Family History: Reviewed, no changes.  Last detailed document: 01/14/2015.   Social History: Tobacco use reviewed. Reviewed, no changes. Last detailed document date: 01/14/2015.      MEDICATIONS(added, continued or stopped this visit): Started Medication Directions Instruction Stopped   calcium-magnesium-zinc tablet Take daily     fish oil Take daily    04/24/2015 Mobic 7.5 mg tablet take 1 - 2 tablets by oral route  every day    03/08/2015 Mobic 7.5 mg tablet take 1 - 2 tablets by oral route  every day  04/24/2015  04/24/2015 tramadol 50 mg tablet take 1 tablet by oral route  every 6 hours as needed     VITAMIN E ACETATE Take daily       ALLERGIES: Ingredient Reaction Medication Name Comment  PENICILLINS Unknown     Reviewed, no changes.    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  I recommended proceeding  with expiration of fusion and extension of fusion to the L5-S1 level.  The patient was fitted for also brace.  Detailed patient testing was performed with the patient, by myself and Georgiann Cocker RN  Completed Orders (this encounter) Order Details Reason Side Interpretation Result Initial Treatment Date Region  Hypertension education Continue to monitor blood pressure. If remains elevated, contact primary care physician.         Assessment/Plan # Detail Type Description   1. Assessment Scoliosis (and kyphoscoliosis), idiopathic (M41.20).       2. 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       3.  Assessment Radiculopathy, lumbosacral region (M54.17).       4. Assessment Radiculopathy, lumbar region (M54.16).       5. Assessment Low back pain (M54.5).       6. Assessment Idiopathic scoliosis of lumbar region (M41.26).       7. Assessment DDD (degenerative disc disease), lumbar (M51.36).       8. Assessment Elevated blood-pressure reading, w/o diagnosis of htn (R03.0).         Pain Assessment/Treatment Pain Scale: 3/10. Method: Numeric Pain Intensity Scale. Location: back/leg. Onset: 07/14/2014. Duration: varies. Quality: discomforting. Pain Assessment/Treatment follow-up plan of care: Patient is taking medications as prescribed..  Plan to proceed with exploration and extension of fusion to the L5-S1 level.  Orders: Diagnostic Procedures: Assessment Procedure  M54.16 PLIF L5-S1, expl & connect to L2-L4 construct  Instruction(s)/Education: Assessment Instruction  R03.0 Hypertension education    MEDICATIONS PRESCRIBED TODAY    Rx Quantity Refills  TRAMADOL HCL 50 mg  60 0  MOBIC 7.5 mg  60 0            Provider:  Danae Orleans. Venetia Maxon MD  04/27/2015 12:46 PM Dictation edited by: Danae Orleans. Venetia Maxon    CC Providers: Pati Gallo [address] [place], [place] [address]-              Electronically signed by Danae Orleans Venetia Maxon MD on 04/27/2015 12:46 PM

## 2015-06-21 ENCOUNTER — Encounter (HOSPITAL_COMMUNITY)
Admission: RE | Disposition: A | Discharge status: Home / Self Care | Payer: Self-pay | Source: Ambulatory Visit | Attending: Neurosurgery

## 2015-06-21 ENCOUNTER — Inpatient Hospital Stay (HOSPITAL_COMMUNITY)
Admission: RE | Admit: 2015-06-21 | Discharge: 2015-06-23 | DRG: 460 | Disposition: A | Discharge status: Home / Self Care | Payer: BLUE CROSS/BLUE SHIELD | Source: Ambulatory Visit | Attending: Neurosurgery | Admitting: Neurosurgery

## 2015-06-21 ENCOUNTER — Inpatient Hospital Stay (HOSPITAL_COMMUNITY): Payer: BLUE CROSS/BLUE SHIELD

## 2015-06-21 ENCOUNTER — Inpatient Hospital Stay (HOSPITAL_COMMUNITY): Payer: BLUE CROSS/BLUE SHIELD | Admitting: Certified Registered Nurse Anesthetist

## 2015-06-21 ENCOUNTER — Encounter (HOSPITAL_COMMUNITY): Payer: Self-pay | Admitting: *Deleted

## 2015-06-21 DIAGNOSIS — J449 Chronic obstructive pulmonary disease, unspecified: Secondary | ICD-10-CM | POA: Diagnosis present

## 2015-06-21 DIAGNOSIS — M4326 Fusion of spine, lumbar region: Secondary | ICD-10-CM

## 2015-06-21 DIAGNOSIS — M1990 Unspecified osteoarthritis, unspecified site: Secondary | ICD-10-CM | POA: Diagnosis present

## 2015-06-21 DIAGNOSIS — M4127 Other idiopathic scoliosis, lumbosacral region: Secondary | ICD-10-CM | POA: Diagnosis present

## 2015-06-21 DIAGNOSIS — M4807 Spinal stenosis, lumbosacral region: Secondary | ICD-10-CM | POA: Diagnosis present

## 2015-06-21 DIAGNOSIS — Z880 Allergy status to penicillin: Secondary | ICD-10-CM

## 2015-06-21 DIAGNOSIS — M5116 Intervertebral disc disorders with radiculopathy, lumbar region: Secondary | ICD-10-CM | POA: Diagnosis present

## 2015-06-21 DIAGNOSIS — G548 Other nerve root and plexus disorders: Secondary | ICD-10-CM | POA: Diagnosis present

## 2015-06-21 DIAGNOSIS — Z79899 Other long term (current) drug therapy: Secondary | ICD-10-CM

## 2015-06-21 DIAGNOSIS — M419 Scoliosis, unspecified: Secondary | ICD-10-CM | POA: Diagnosis present

## 2015-06-21 DIAGNOSIS — Z87891 Personal history of nicotine dependence: Secondary | ICD-10-CM

## 2015-06-21 DIAGNOSIS — M549 Dorsalgia, unspecified: Secondary | ICD-10-CM

## 2015-06-21 DIAGNOSIS — K219 Gastro-esophageal reflux disease without esophagitis: Secondary | ICD-10-CM | POA: Diagnosis present

## 2015-06-21 SURGERY — POSTERIOR LUMBAR FUSION 2 LEVEL
Anesthesia: General | Site: Back

## 2015-06-21 MED ORDER — KCL IN DEXTROSE-NACL 20-5-0.45 MEQ/L-%-% IV SOLN: INTRAVENOUS | Status: DC

## 2015-06-21 MED ORDER — ACETAMINOPHEN 325 MG PO TABS: 650.0000 mg | ORAL_TABLET | Freq: Four times a day (QID) | ORAL | Status: DC | PRN

## 2015-06-21 MED ORDER — PROPOFOL 10 MG/ML IV BOLUS
INTRAVENOUS | Status: DC | PRN
  Administered 2015-06-21: 150 mg via INTRAVENOUS

## 2015-06-21 MED ORDER — DEXAMETHASONE SODIUM PHOSPHATE 10 MG/ML IJ SOLN
INTRAMUSCULAR | Status: DC | PRN
  Administered 2015-06-21: 10 mg via INTRAVENOUS

## 2015-06-21 MED ORDER — THROMBIN 20000 UNITS EX SOLR
CUTANEOUS | Status: DC | PRN
  Administered 2015-06-21: 20 mL via TOPICAL

## 2015-06-21 MED ORDER — SUCCINYLCHOLINE CHLORIDE 20 MG/ML IJ SOLN
INTRAMUSCULAR | Status: AC
  Filled 2015-06-21: qty 1

## 2015-06-21 MED ORDER — PROPOFOL 10 MG/ML IV BOLUS
INTRAVENOUS | Status: AC
  Filled 2015-06-21: qty 20

## 2015-06-21 MED ORDER — ONDANSETRON HCL 4 MG/2ML IJ SOLN: 4.0000 mg | INTRAMUSCULAR | Status: DC | PRN

## 2015-06-21 MED ORDER — DEXTROSE 5 % IV SOLN
500.0000 mg | Freq: Four times a day (QID) | INTRAVENOUS | Status: DC | PRN
  Filled 2015-06-21: qty 5

## 2015-06-21 MED ORDER — LIDOCAINE-EPINEPHRINE 1 %-1:100000 IJ SOLN
INTRAMUSCULAR | Status: DC | PRN
  Administered 2015-06-21: 5 mL

## 2015-06-21 MED ORDER — ALUM & MAG HYDROXIDE-SIMETH 200-200-20 MG/5ML PO SUSP: 30.0000 mL | Freq: Four times a day (QID) | ORAL | Status: DC | PRN

## 2015-06-21 MED ORDER — MIDAZOLAM HCL 5 MG/5ML IJ SOLN
INTRAMUSCULAR | Status: DC | PRN
  Administered 2015-06-21: 2 mg via INTRAVENOUS

## 2015-06-21 MED ORDER — SUFENTANIL CITRATE 50 MCG/ML IV SOLN
INTRAVENOUS | Status: DC | PRN
  Administered 2015-06-21: 20 ug via INTRAVENOUS
  Administered 2015-06-21 (×2): 10 ug via INTRAVENOUS

## 2015-06-21 MED ORDER — SODIUM CHLORIDE 0.9 % IJ SOLN: 3.0000 mL | INTRAMUSCULAR | Status: DC | PRN

## 2015-06-21 MED ORDER — VANCOMYCIN HCL IN DEXTROSE 1-5 GM/200ML-% IV SOLN
INTRAVENOUS | Status: AC
  Administered 2015-06-21: 1000 mg via INTRAVENOUS
  Filled 2015-06-21: qty 200

## 2015-06-21 MED ORDER — SUFENTANIL CITRATE 50 MCG/ML IV SOLN
INTRAVENOUS | Status: AC
  Filled 2015-06-21: qty 1

## 2015-06-21 MED ORDER — HYDROMORPHONE HCL 1 MG/ML IJ SOLN: 0.5000 mg | INTRAMUSCULAR | Status: DC | PRN

## 2015-06-21 MED ORDER — TRAMADOL HCL 50 MG PO TABS
50.0000 mg | ORAL_TABLET | Freq: Four times a day (QID) | ORAL | Status: DC | PRN
Start: 2015-06-21 — End: 2015-06-23

## 2015-06-21 MED ORDER — BISACODYL 10 MG RE SUPP: 10.0000 mg | Freq: Every day | RECTAL | Status: DC | PRN

## 2015-06-21 MED ORDER — PANTOPRAZOLE SODIUM 40 MG IV SOLR
40.0000 mg | Freq: Every day | INTRAVENOUS | Status: DC
  Administered 2015-06-21: 40 mg via INTRAVENOUS
  Filled 2015-06-21: qty 40

## 2015-06-21 MED ORDER — ONDANSETRON HCL 4 MG/2ML IJ SOLN: 4.0000 mg | Freq: Once | INTRAMUSCULAR | Status: DC | PRN

## 2015-06-21 MED ORDER — VANCOMYCIN HCL IN DEXTROSE 1-5 GM/200ML-% IV SOLN
1000.0000 mg | Freq: Once | INTRAVENOUS | Status: AC
  Administered 2015-06-21: 1000 mg via INTRAVENOUS
  Filled 2015-06-21: qty 200

## 2015-06-21 MED ORDER — LIDOCAINE HCL (CARDIAC) 20 MG/ML IV SOLN
INTRAVENOUS | Status: DC | PRN
  Administered 2015-06-21: 100 mg via INTRAVENOUS

## 2015-06-21 MED ORDER — MUPIROCIN 2 % EX OINT
1.0000 "application " | TOPICAL_OINTMENT | Freq: Two times a day (BID) | CUTANEOUS | Status: DC
  Administered 2015-06-21: 1 via TOPICAL

## 2015-06-21 MED ORDER — HYDROMORPHONE HCL 1 MG/ML IJ SOLN
INTRAMUSCULAR | Status: AC
  Filled 2015-06-21: qty 1

## 2015-06-21 MED ORDER — DOXYLAMINE SUCCINATE (SLEEP) 25 MG PO TABS: 25.0000 mg | ORAL_TABLET | Freq: Every evening | ORAL | Status: DC | PRN

## 2015-06-21 MED ORDER — OXYCODONE-ACETAMINOPHEN 5-325 MG PO TABS
1.0000 | ORAL_TABLET | ORAL | Status: DC | PRN
  Administered 2015-06-21 – 2015-06-23 (×7): 2 via ORAL
  Filled 2015-06-21 (×7): qty 2

## 2015-06-21 MED ORDER — FLEET ENEMA 7-19 GM/118ML RE ENEM: 1.0000 | ENEMA | Freq: Once | RECTAL | Status: AC | PRN

## 2015-06-21 MED ORDER — BUPIVACAINE HCL (PF) 0.5 % IJ SOLN
INTRAMUSCULAR | Status: DC | PRN
  Administered 2015-06-21: 5 mL

## 2015-06-21 MED ORDER — ROCURONIUM BROMIDE 50 MG/5ML IV SOLN
INTRAVENOUS | Status: AC
  Filled 2015-06-21: qty 1

## 2015-06-21 MED ORDER — LIDOCAINE HCL (CARDIAC) 20 MG/ML IV SOLN
INTRAVENOUS | Status: AC
  Filled 2015-06-21: qty 5

## 2015-06-21 MED ORDER — ACETAMINOPHEN 325 MG PO TABS
650.0000 mg | ORAL_TABLET | ORAL | Status: DC | PRN
Start: 2015-06-21 — End: 2015-06-23

## 2015-06-21 MED ORDER — DOCUSATE SODIUM 100 MG PO CAPS
100.0000 mg | ORAL_CAPSULE | Freq: Two times a day (BID) | ORAL | Status: DC
  Administered 2015-06-21 – 2015-06-23 (×4): 100 mg via ORAL
  Filled 2015-06-21 (×4): qty 1

## 2015-06-21 MED ORDER — MUPIROCIN 2 % EX OINT
TOPICAL_OINTMENT | CUTANEOUS | Status: AC
  Filled 2015-06-21: qty 22

## 2015-06-21 MED ORDER — MENTHOL 3 MG MT LOZG: 1.0000 | LOZENGE | OROMUCOSAL | Status: DC | PRN

## 2015-06-21 MED ORDER — BUPIVACAINE LIPOSOME 1.3 % IJ SUSP
20.0000 mL | INTRAMUSCULAR | Status: DC
  Filled 2015-06-21: qty 20

## 2015-06-21 MED ORDER — ALBUTEROL SULFATE (2.5 MG/3ML) 0.083% IN NEBU: 3.0000 mL | INHALATION_SOLUTION | Freq: Four times a day (QID) | RESPIRATORY_TRACT | Status: DC | PRN

## 2015-06-21 MED ORDER — LACTATED RINGERS IV SOLN
INTRAVENOUS | Status: DC | PRN
  Administered 2015-06-21 (×2): via INTRAVENOUS

## 2015-06-21 MED ORDER — SODIUM CHLORIDE 0.9 % IV SOLN: 250.0000 mL | INTRAVENOUS | Status: DC

## 2015-06-21 MED ORDER — TIOTROPIUM BROMIDE-OLODATEROL 2.5-2.5 MCG/ACT IN AERS
2.0000 | INHALATION_SPRAY | Freq: Every day | RESPIRATORY_TRACT | Status: DC
  Administered 2015-06-22: 2 via RESPIRATORY_TRACT

## 2015-06-21 MED ORDER — ONDANSETRON HCL 4 MG/2ML IJ SOLN
INTRAMUSCULAR | Status: DC | PRN
  Administered 2015-06-21: 4 mg via INTRAVENOUS

## 2015-06-21 MED ORDER — ROCURONIUM BROMIDE 100 MG/10ML IV SOLN
INTRAVENOUS | Status: DC | PRN
  Administered 2015-06-21: 50 mg via INTRAVENOUS

## 2015-06-21 MED ORDER — 0.9 % SODIUM CHLORIDE (POUR BTL) OPTIME
TOPICAL | Status: DC | PRN
  Administered 2015-06-21: 1000 mL

## 2015-06-21 MED ORDER — POLYETHYLENE GLYCOL 3350 17 G PO PACK
17.0000 g | PACK | Freq: Every day | ORAL | Status: DC | PRN
  Administered 2015-06-22: 17 g via ORAL
  Filled 2015-06-21: qty 1

## 2015-06-21 MED ORDER — VANCOMYCIN HCL IN DEXTROSE 1-5 GM/200ML-% IV SOLN: 1000.0000 mg | INTRAVENOUS | Status: DC

## 2015-06-21 MED ORDER — METHOCARBAMOL 500 MG PO TABS
500.0000 mg | ORAL_TABLET | Freq: Four times a day (QID) | ORAL | Status: DC | PRN
  Administered 2015-06-22 – 2015-06-23 (×4): 500 mg via ORAL
  Filled 2015-06-21 (×5): qty 1

## 2015-06-21 MED ORDER — ACETAMINOPHEN 650 MG RE SUPP: 650.0000 mg | RECTAL | Status: DC | PRN

## 2015-06-21 MED ORDER — HYDROMORPHONE HCL 1 MG/ML IJ SOLN
0.2500 mg | INTRAMUSCULAR | Status: DC | PRN
  Administered 2015-06-21 (×4): 0.5 mg via INTRAVENOUS

## 2015-06-21 MED ORDER — SODIUM CHLORIDE 0.9 % IJ SOLN
INTRAMUSCULAR | Status: AC
  Filled 2015-06-21: qty 10

## 2015-06-21 MED ORDER — MIDAZOLAM HCL 2 MG/2ML IJ SOLN
INTRAMUSCULAR | Status: AC
  Filled 2015-06-21: qty 4

## 2015-06-21 MED ORDER — SODIUM CHLORIDE 0.9 % IJ SOLN: 3.0000 mL | Freq: Two times a day (BID) | INTRAMUSCULAR | Status: DC

## 2015-06-21 MED ORDER — PHENOL 1.4 % MT LIQD: 1.0000 | OROMUCOSAL | Status: DC | PRN

## 2015-06-21 MED ORDER — HYDROCODONE-ACETAMINOPHEN 5-325 MG PO TABS
1.0000 | ORAL_TABLET | ORAL | Status: DC | PRN
  Administered 2015-06-21 – 2015-06-22 (×2): 2 via ORAL
  Filled 2015-06-21 (×2): qty 2

## 2015-06-21 SURGICAL SUPPLY — 75 items
BENZOIN TINCTURE PRP APPL 2/3 (GAUZE/BANDAGES/DRESSINGS) IMPLANT
BLADE CLIPPER SURG (BLADE) IMPLANT
BONE MATRIX OSTEOCEL PRO MED (Bone Implant) ×3 IMPLANT
BUR MATCHSTICK NEURO 3.0 LAGG (BURR) ×3 IMPLANT
BUR PRECISION FLUTE 5.0 (BURR) ×3 IMPLANT
CAGE COROENT LG 10X9X23-12 (Cage) ×6 IMPLANT
CANISTER SUCT 3000ML PPV (MISCELLANEOUS) ×3 IMPLANT
CLOSURE WOUND 1/2 X4 (GAUZE/BANDAGES/DRESSINGS) ×1
CONNECTOR RELINE ROTATE 5-6MM (Connector) ×6 IMPLANT
CONT SPEC 4OZ CLIKSEAL STRL BL (MISCELLANEOUS) ×3 IMPLANT
COVER BACK TABLE 60X90IN (DRAPES) ×3 IMPLANT
DECANTER SPIKE VIAL GLASS SM (MISCELLANEOUS) ×3 IMPLANT
DERMABOND ADHESIVE PROPEN (GAUZE/BANDAGES/DRESSINGS) ×2
DERMABOND ADVANCED (GAUZE/BANDAGES/DRESSINGS) ×2
DERMABOND ADVANCED .7 DNX12 (GAUZE/BANDAGES/DRESSINGS) ×1 IMPLANT
DERMABOND ADVANCED .7 DNX6 (GAUZE/BANDAGES/DRESSINGS) ×1 IMPLANT
DRAPE C-ARM 42X72 X-RAY (DRAPES) ×6 IMPLANT
DRAPE C-ARMOR (DRAPES) IMPLANT
DRAPE LAPAROTOMY 100X72X124 (DRAPES) ×3 IMPLANT
DRAPE POUCH INSTRU U-SHP 10X18 (DRAPES) ×3 IMPLANT
DRAPE SURG 17X23 STRL (DRAPES) ×3 IMPLANT
DRSG OPSITE POSTOP 4X6 (GAUZE/BANDAGES/DRESSINGS) ×3 IMPLANT
DURAPREP 26ML APPLICATOR (WOUND CARE) ×3 IMPLANT
ELECT REM PT RETURN 9FT ADLT (ELECTROSURGICAL) ×3
ELECTRODE REM PT RTRN 9FT ADLT (ELECTROSURGICAL) ×1 IMPLANT
EVACUATOR 1/8 PVC DRAIN (DRAIN) IMPLANT
GAUZE SPONGE 4X4 12PLY STRL (GAUZE/BANDAGES/DRESSINGS) ×3 IMPLANT
GAUZE SPONGE 4X4 16PLY XRAY LF (GAUZE/BANDAGES/DRESSINGS) IMPLANT
GLOVE BIO SURGEON STRL SZ8 (GLOVE) ×6 IMPLANT
GLOVE BIOGEL PI IND STRL 8 (GLOVE) ×2 IMPLANT
GLOVE BIOGEL PI IND STRL 8.5 (GLOVE) ×2 IMPLANT
GLOVE BIOGEL PI INDICATOR 8 (GLOVE) ×4
GLOVE BIOGEL PI INDICATOR 8.5 (GLOVE) ×4
GLOVE ECLIPSE 8.0 STRL XLNG CF (GLOVE) ×6 IMPLANT
GLOVE EXAM NITRILE LRG STRL (GLOVE) IMPLANT
GLOVE EXAM NITRILE MD LF STRL (GLOVE) IMPLANT
GLOVE EXAM NITRILE XL STR (GLOVE) IMPLANT
GLOVE EXAM NITRILE XS STR PU (GLOVE) IMPLANT
GOWN STRL REUS W/ TWL LRG LVL3 (GOWN DISPOSABLE) IMPLANT
GOWN STRL REUS W/ TWL XL LVL3 (GOWN DISPOSABLE) ×3 IMPLANT
GOWN STRL REUS W/TWL 2XL LVL3 (GOWN DISPOSABLE) IMPLANT
GOWN STRL REUS W/TWL LRG LVL3 (GOWN DISPOSABLE)
GOWN STRL REUS W/TWL XL LVL3 (GOWN DISPOSABLE) ×6
KIT BASIN OR (CUSTOM PROCEDURE TRAY) ×3 IMPLANT
KIT POSITION SURG JACKSON T1 (MISCELLANEOUS) ×3 IMPLANT
KIT ROOM TURNOVER OR (KITS) ×3 IMPLANT
MILL MEDIUM DISP (BLADE) ×6 IMPLANT
NEEDLE HYPO 25X1 1.5 SAFETY (NEEDLE) ×3 IMPLANT
NEEDLE SPNL 18GX3.5 QUINCKE PK (NEEDLE) IMPLANT
NS IRRIG 1000ML POUR BTL (IV SOLUTION) ×3 IMPLANT
PACK LAMINECTOMY NEURO (CUSTOM PROCEDURE TRAY) ×3 IMPLANT
PAD ARMBOARD 7.5X6 YLW CONV (MISCELLANEOUS) ×9 IMPLANT
PATTIES SURGICAL .5 X.5 (GAUZE/BANDAGES/DRESSINGS) IMPLANT
PATTIES SURGICAL .5 X1 (DISPOSABLE) IMPLANT
PATTIES SURGICAL 1X1 (DISPOSABLE) IMPLANT
ROD RELINE LORDOTIC 5.5X45 (Rod) ×3 IMPLANT
ROD RELINE-O LORD 5.5X50MM (Rod) ×3 IMPLANT
SCREW LOCK RELINE 5.5 TULIP (Screw) ×12 IMPLANT
SCREW RELINE-O POLY 6.5X45 (Screw) ×6 IMPLANT
SPONGE LAP 4X18 X RAY DECT (DISPOSABLE) IMPLANT
SPONGE SURGIFOAM ABS GEL 100 (HEMOSTASIS) ×3 IMPLANT
STAPLER SKIN PROX WIDE 3.9 (STAPLE) IMPLANT
STRIP CLOSURE SKIN 1/2X4 (GAUZE/BANDAGES/DRESSINGS) ×2 IMPLANT
SUT VIC AB 1 CT1 18XBRD ANBCTR (SUTURE) ×2 IMPLANT
SUT VIC AB 1 CT1 8-18 (SUTURE) ×4
SUT VIC AB 2-0 CT1 18 (SUTURE) ×6 IMPLANT
SUT VIC AB 3-0 SH 8-18 (SUTURE) ×6 IMPLANT
SYR 20ML ECCENTRIC (SYRINGE) ×3 IMPLANT
SYR 3ML LL SCALE MARK (SYRINGE) ×12 IMPLANT
SYR 5ML LL (SYRINGE) IMPLANT
TOWEL OR 17X24 6PK STRL BLUE (TOWEL DISPOSABLE) ×3 IMPLANT
TOWEL OR 17X26 10 PK STRL BLUE (TOWEL DISPOSABLE) ×3 IMPLANT
TRAP SPECIMEN MUCOUS 40CC (MISCELLANEOUS) ×3 IMPLANT
TRAY FOLEY W/METER SILVER 14FR (SET/KITS/TRAYS/PACK) ×3 IMPLANT
WATER STERILE IRR 1000ML POUR (IV SOLUTION) ×3 IMPLANT

## 2015-06-21 NOTE — Op Note (Signed)
06/21/2015  12:02 PM  PATIENT:  Dale Woods  64 y.o. male  PRE-OPERATIVE DIAGNOSIS:  Radiculopathy, Lumbar region; Scoliosis, Idiopathic; Displacement of lumbar intervertebral disc; Low back pain L 5 S 1  POST-OPERATIVE DIAGNOSIS:  Radiculopathy, Lumbar region; Scoliosis, Idiopathic; Displacement of lumbar intervertebral disc; Low back pain L 5 S 1  PROCEDURE:  Procedure(s) with comments: L5-S1 Posterior lumbar interbody fusion with exploration of L3 - L5 Fusion (N/A) - L5-S1 Posterior lumbar interbody fusion with exploration and connect to L3-L5 construct  Decompression greater than standard PLIF procedure  SURGEON:  Surgeon(s) and Role:    * Jaana Brodt, MD - Primary  PHYSICIAN ASSISTANT: Nudelman, MD  ASSISTANTS: Poteat, RN   ANESTHESIA:   general  EBL:  Total I/O In: 1300 [I.V.:1300] Out: 660 [Urine:510; Blood:150]  BLOOD ADMINISTERED:none  DRAINS: none   LOCAL MEDICATIONS USED:  MARCAINE    and LIDOCAINE   SPECIMEN:  No Specimen  DISPOSITION OF SPECIMEN:  N/A  COUNTS:  YES  TOURNIQUET:  * No tourniquets in log *  DICTATION: DICTATION: Patient is a 64-year-old with scoliosis , stenosis, spondylolisthesis, disc herniation and severe back and left lower extremity pain at L5S1 levels of the lumbar spine. It was elected to take him to surgery for exploration of prior fusion with decompression and fusion L 5 S 1 level  level with posterolateral arthrodesis.  Procedure:   Following uncomplicated induction of GETA,  patient was turned into a prone position on the Jackson table the area of planned incision was marked, prepped with betadine scrub and Duraprep, then draped. Exposure was performed of facet joint complex at L 45 and L 5 S 1 level and the retractor was placed.Intraop X ray was obtained to confirm correct orientation.  Prior fusion and hardware was inspected.  There appeared to be solid arthrodesis at previously operated levels.  A total laminectomy of L 5  was then performed with disarticulation of facets.  This bone was saved for grafting, combined with Osteocel after being run through bone mill and was placed in bone packing device.  Thorough discectomy was performed bilaterally at L 5S1 and the endplates were prepared for grafting.  Decompression was far greater than for standard PLIF procedure.  This involved thorough discectomy with removal of far lateral herniated disc material with thorough decompression of all neural elements.  23 x 10 x 12 degree cages were placed in the interspace and positioning was confirmed with AP and lateral fluoroscopy.  12 cc of autograft/Osteocel was packed in the interspace medial to the second cage.   Remaining screws were placed at S1 and 45 mm rods were placed with connectors to the rods between L 4 and L 5 levels.   And the screws were locked and torqued.Final Xrays showed well positioned implants and screw fixation. The posterolateral region was packed with remaining 10 cc of autograft on each side of midline. The wounds were irrigated and then closed with 1, 2-0 and 3-0 Vicryl stitches. Long-acting Marcaine was injected in the subcutaneous tissues.  Sterile occlusive dressing was placed with Dermabond. The patient was then extubated in the operating room and taken to recovery in stable and satisfactory condition having tolerated her operation well. Counts were correct at the end of the case.  PLAN OF CARE: Admit to inpatient   PATIENT DISPOSITION:  PACU - hemodynamically stable.   Delay start of Pharmacological VTE agent (>24hrs) due to surgical blood loss or risk of bleeding: yes  

## 2015-06-21 NOTE — Progress Notes (Signed)
Awake, alert, conversant.  "My leg feels better."  Full strength bilateral lower extremities, PF/DF.  Doing well.

## 2015-06-21 NOTE — Care Management Note (Signed)
Case Management Note  Patient Details  Name: Dale Woods MRN: [id number] Date of Birth: April 20, 1951  Subjective/Objective:         64 y.o. Male who lives in Private residence with spouse. L5-S1 PLIF with >normal Decompression required. Awaiting PT/OT eval and recommendations. Will follow for discharge needs.            Action/Plan:Will follow for discharge needs.    Expected Discharge Date:                  Expected Discharge Plan:     In-House Referral:     Discharge planning Services     Post Acute Care Choice:    Choice offered to:     DME Arranged:    DME Agency:     HH Arranged:    HH Agency:     Status of Service:     Medicare Important Message Given:    Date Medicare IM Given:    Medicare IM give by:    Date Additional Medicare IM Given:    Additional Medicare Important Message give by:     If discussed at Long Length of Stay Meetings, dates discussed:    Additional Comments:  Yvone Neu, RN 06/21/2015, 4:49 PM

## 2015-06-21 NOTE — Brief Op Note (Signed)
06/21/2015  12:02 PM  PATIENT:  Dale Woods  64 y.o. male  PRE-OPERATIVE DIAGNOSIS:  Radiculopathy, Lumbar region; Scoliosis, Idiopathic; Displacement of lumbar intervertebral disc; Low back pain L 5 S 1  POST-OPERATIVE DIAGNOSIS:  Radiculopathy, Lumbar region; Scoliosis, Idiopathic; Displacement of lumbar intervertebral disc; Low back pain L 5 S 1  PROCEDURE:  Procedure(s) with comments: L5-S1 Posterior lumbar interbody fusion with exploration of L3 - L5 Fusion (N/A) - L5-S1 Posterior lumbar interbody fusion with exploration and connect to L3-L5 construct  Decompression greater than standard PLIF procedure  SURGEON:  Surgeon(s) and Role:    * Maeola Harman, MD - Primary  PHYSICIAN ASSISTANT: Newell Coral, MD  ASSISTANTS: Poteat, RN   ANESTHESIA:   general  EBL:  Total I/O In: 1300 [I.V.:1300] Out: 660 [Urine:510; Blood:150]  BLOOD ADMINISTERED:none  DRAINS: none   LOCAL MEDICATIONS USED:  MARCAINE    and LIDOCAINE   SPECIMEN:  No Specimen  DISPOSITION OF SPECIMEN:  N/A  COUNTS:  YES  TOURNIQUET:  * No tourniquets in log *  DICTATION: DICTATION: Patient is a 64 year old with scoliosis , stenosis, spondylolisthesis, disc herniation and severe back and left lower extremity pain at L5S1 levels of the lumbar spine. It was elected to take him to surgery for exploration of prior fusion with decompression and fusion L 5 S 1 level  level with posterolateral arthrodesis.  Procedure:   Following uncomplicated induction of GETA,  patient was turned into a prone position on the [place] table the area of planned incision was marked, prepped with betadine scrub and Duraprep, then draped. Exposure was performed of facet joint complex at L 45 and L 5 S 1 level and the retractor was placed.Intraop X ray was obtained to confirm correct orientation.  Prior fusion and hardware was inspected.  There appeared to be solid arthrodesis at previously operated levels.  A total laminectomy of L 5  was then performed with disarticulation of facets.  This bone was saved for grafting, combined with Osteocel after being run through bone mill and was placed in bone packing device.  Thorough discectomy was performed bilaterally at L 5S1 and the endplates were prepared for grafting.  Decompression was far greater than for standard PLIF procedure.  This involved thorough discectomy with removal of far lateral herniated disc material with thorough decompression of all neural elements.  23 x 10 x 12 degree cages were placed in the interspace and positioning was confirmed with AP and lateral fluoroscopy.  12 cc of autograft/Osteocel was packed in the interspace medial to the second cage.   Remaining screws were placed at S1 and 45 mm rods were placed with connectors to the rods between L 4 and L 5 levels.   And the screws were locked and torqued.Final Xrays showed well positioned implants and screw fixation. The posterolateral region was packed with remaining 10 cc of autograft on each side of midline. The wounds were irrigated and then closed with 1, 2-0 and 3-0 Vicryl stitches. Long-acting Marcaine was injected in the subcutaneous tissues.  Sterile occlusive dressing was placed with Dermabond. The patient was then extubated in the operating room and taken to recovery in stable and satisfactory condition having tolerated her operation well. Counts were correct at the end of the case.  PLAN OF CARE: Admit to inpatient   PATIENT DISPOSITION:  PACU - hemodynamically stable.   Delay start of Pharmacological VTE agent (>24hrs) due to surgical blood loss or risk of bleeding: yes

## 2015-06-21 NOTE — Anesthesia Preprocedure Evaluation (Addendum)
Anesthesia Evaluation  Patient identified by MRN, date of birth, ID band Patient awake    Reviewed: Allergy & Precautions, NPO status , Patient's Chart, lab work & pertinent test results  History of Anesthesia Complications (+) Family history of anesthesia reaction and history of anesthetic complications  Airway Mallampati: III  TM Distance: >3 FB Neck ROM: Full    Dental  (+) Teeth Intact, Dental Advisory Given   Pulmonary neg pulmonary ROS, COPD COPD inhaler, former smoker,  breath sounds clear to auscultation  Pulmonary exam normal       Cardiovascular Exercise Tolerance: Good negative cardio ROS Normal cardiovascular examRhythm:Regular Rate:Normal     Neuro/Psych PSYCHIATRIC DISORDERS Anxiety negative neurological ROS     GI/Hepatic negative GI ROS, Neg liver ROS, GERD-  Medicated,  Endo/Other  negative endocrine ROS  Renal/GU negative Renal ROS     Musculoskeletal  (+) Arthritis -, Osteoarthritis,    Abdominal   Peds  Hematology negative hematology ROS (+)   Anesthesia Other Findings Patient's mother was "sensitive" to anesthesia in the past.  No history of anesthetic problems per patient report.  Reproductive/Obstetrics                            Anesthesia Physical Anesthesia Plan  ASA: II  Anesthesia Plan: General   Post-op Pain Management:    Induction: Intravenous  Airway Management Planned: Oral ETT  Additional Equipment:   Intra-op Plan:   Post-operative Plan: Extubation in OR  Informed Consent: I have reviewed the patients History and Physical, chart, labs and discussed the procedure including the risks, benefits and alternatives for the proposed anesthesia with the patient or authorized representative who has indicated his/her understanding and acceptance.   Dental advisory given  Plan Discussed with: CRNA  Anesthesia Plan Comments: (Risks/benefits of general  anesthesia discussed with patient including risk of damage to teeth, lips, gum, and tongue, nausea/vomiting, allergic reactions to medications, and the possibility of heart attack, stroke and death.  All patient questions answered.  Patient wishes to proceed.)       Anesthesia Quick Evaluation

## 2015-06-21 NOTE — Progress Notes (Signed)
ANTIBIOTIC CONSULT NOTE - INITIAL  Pharmacy Consult:  Vancomycin Indication:  Surgical prophylaxis   Allergies  Allergen Reactions  . Penicillins Anaphylaxis    Patient Measurements: Height:  (188 cm) Weight: 189 lb 3.2 oz (85.821 kg) IBW/kg (Calculated) : 82.2  Vital Signs: Temp: 98.3 F (36.8 C) (08/05 1630) Temp Source: Oral (08/05 1630) BP: 129/75 mmHg (08/05 1630) Pulse Rate: 83 (08/05 1630) Intake/Output from this shift: Total I/O In: 1300 [I.V.:1300] Out: 1060 [Urine:910; Blood:150]  Labs: No results for input(s): WBC, HGB, PLT, LABCREA, CREATININE in the last 72 hours. Estimated Creatinine Clearance: 78.1 mL/min (by C-G formula based on Cr of 1.14). No results for input(s): VANCOTROUGH, VANCOPEAK, VANCORANDOM, GENTTROUGH, GENTPEAK, GENTRANDOM, TOBRATROUGH, TOBRAPEAK, TOBRARND, AMIKACINPEAK, AMIKACINTROU, AMIKACIN in the last 72 hours.   Microbiology: Recent Results (from the past 720 hour(s))  Surgical pcr screen     Status: Abnormal   Collection Time: 06/17/15  9:49 AM  Result Value Ref Range Status   MRSA, PCR NEGATIVE NEGATIVE Final   Staphylococcus aureus POSITIVE (A) NEGATIVE Final    Comment:        The Xpert SA Assay (FDA approved for NASAL specimens in patients over 62 years of age), is one component of a comprehensive surveillance program.  Test performance has been validated by [facility] for patients greater than or equal to 62 year old. It is not intended to diagnose infection nor to guide or monitor treatment.     Medical History: Past Medical History  Diagnosis Date  . Allergic rhinitis   . COPD (chronic obstructive pulmonary disease)   . GERD (gastroesophageal reflux disease)   . Family history of adverse reaction to anesthesia     Mother- "crash wiwth anesthesia"- high doeses  . Anxiety   . Shortness of breath dyspnea       Assessment: 64 YOM s/p spinal surgery to continue vancomycin post-op for surgical prophylaxis.   Patient does not have a drain, so will order one dose post-op.  He received vancomycin around 0620 today prior to procedure.  Pre-op labs reviewed.   Goal of Therapy:  Infection prevention   Plan:  - Vanc 1gm IV x 1 at 2030 - Pharmacy will sign off.  Please reconsult if needed.    Anetria Harwick D. Laney Potash, PharmD, BCPS Pager:  [phone] 06/21/2015, 4:50 PM

## 2015-06-21 NOTE — Anesthesia Procedure Notes (Signed)
Procedure Name: Intubation Date/Time: 06/21/2015 8:47 AM Performed by: Charm Barges, Charnese Federici R Pre-anesthesia Checklist: Patient identified, Emergency Drugs available, Suction available, Patient being monitored and Timeout performed Patient Re-evaluated:Patient Re-evaluated prior to inductionOxygen Delivery Method: Circle system utilized Preoxygenation: Pre-oxygenation with 100% oxygen Intubation Type: IV induction Ventilation: Mask ventilation without difficulty Laryngoscope Size: Mac and 4 Grade View: Grade III Tube type: Oral Tube size: 8.0 mm Number of attempts: 1 Airway Equipment and Method: Stylet Placement Confirmation: ETT inserted through vocal cords under direct vision,  breath sounds checked- equal and bilateral and positive ETCO2 Secured at: 23 cm Tube secured with: Tape Dental Injury: Teeth and Oropharynx as per pre-operative assessment

## 2015-06-21 NOTE — Transfer of Care (Signed)
Immediate Anesthesia Transfer of Care Note  Patient: Dale Woods  Procedure(s) Performed: Procedure(s) with comments: L5-S1 Posterior lumbar interbody fusion with exploration of L3 - L5 Fusion (N/A) - L5-S1 Posterior lumbar interbody fusion with exploration and connect to L2-L4 construct  Patient Location: PACU  Anesthesia Type:General  Level of Consciousness: awake, oriented and patient cooperative  Airway & Oxygen Therapy: Patient Spontanous Breathing and Patient connected to nasal cannula oxygen  Post-op Assessment: Report given to RN, Post -op Vital signs reviewed and stable and Patient moving all extremities  Post vital signs: Reviewed and stable  Last Vitals:  Filed Vitals:   06/21/15 0609  BP: 156/90  Pulse: 71  Temp: 36.9 C  Resp: 16    Complications: No apparent anesthesia complications

## 2015-06-21 NOTE — Progress Notes (Signed)
Transported per [place] NT

## 2015-06-21 NOTE — Anesthesia Postprocedure Evaluation (Signed)
  Anesthesia Post-op Note  Patient: Dale Woods  Procedure(s) Performed: Procedure(s) with comments: L5-S1 Posterior lumbar interbody fusion with exploration of L3 - L5 Fusion (N/A) - L5-S1 Posterior lumbar interbody fusion with exploration and connect to L2-L4 construct  Patient Location: PACU  Anesthesia Type:General  Level of Consciousness: awake, alert  and oriented  Airway and Oxygen Therapy: Patient Spontanous Breathing  Post-op Pain: none  Post-op Assessment: Post-op Vital signs reviewed, Patient's Cardiovascular Status Stable, Respiratory Function Stable, Patent Airway, No signs of Nausea or vomiting and Pain level controlled              Post-op Vital Signs: Reviewed and stable  Last Vitals:  Filed Vitals:   06/21/15 1307  BP: 136/73  Pulse: 79  Temp:   Resp: 14    Complications: No apparent anesthesia complications

## 2015-06-22 MED ORDER — PANTOPRAZOLE SODIUM 40 MG PO TBEC
40.0000 mg | DELAYED_RELEASE_TABLET | Freq: Every day | ORAL | Status: DC
  Administered 2015-06-22 – 2015-06-23 (×2): 40 mg via ORAL
  Filled 2015-06-22 (×2): qty 1

## 2015-06-22 MED ORDER — TIOTROPIUM BROMIDE-OLODATEROL 2.5-2.5 MCG/ACT IN AERS
2.0000 | INHALATION_SPRAY | Freq: Every day | RESPIRATORY_TRACT | Status: DC
  Administered 2015-06-23: 09:00:00 via RESPIRATORY_TRACT

## 2015-06-22 NOTE — Care Management Note (Signed)
Case Management Note  Patient Details  Name: Dale Woods MRN: [id number] Date of Birth: [birth year]  Subjective/Objective:                    Action/Plan:   Expected Discharge Date:                  Expected Discharge Plan:     In-House Referral:     Discharge planning Services  CM Consult  Post Acute Care Choice:    Choice offered to:     DME Arranged:    DME Agency:     HH Arranged:    HH Agency:     Status of Service:  In process, will continue to follow  Medicare Important Message Given:    Date Medicare IM Given:    Medicare IM give by:    Date Additional Medicare IM Given:    Additional Medicare Important Message give by:     If discussed at Long Length of Stay Meetings, dates discussed:    Additional Comments: PT did not recommend HH services or DME.  Isaias Cowman, RN 06/22/2015, 12:34 PM

## 2015-06-22 NOTE — Progress Notes (Signed)
Patient ID: Dale Woods, male   DOB: 11-08-1951, 64 y.o.   MRN: [id number] Doing well no leg pain still condition back soreness just had a catheter removed so has not voided yet  Strength 5 out of 5 wound clean dry and intact  Mobilized today if urinating possible discharge tomorrow

## 2015-06-22 NOTE — Progress Notes (Signed)
Physical Therapy Evaluation Patient Details Name: Dale Woods MRN: [id number] DOB: [birth year] Today's Date: 06/22/2015   History of Present Illness  64 yo male post PLIF L5-S1 with connection of hardware to prior L3-L5 construct on 06/21/15.  Clinical Impression  Patient seen with fiancee present, review back precautions, don/doff of back brace, and provided handout.  Bed mobility with MIN/Supervision and log roll technique, Transfer with Supervision, stairs with MIN/Supervision.  Patient cleared to ambulate with fiancee or staff assistance at this time.  Patient will benefit from additional follow up PT tomorrow for final review, otherwise clear for return home when medically appropriate.  Good compliance with precautions.    Follow Up Recommendations No PT follow up;Supervision/Assistance - 24 hour    Equipment Recommendations  None recommended by PT    Recommendations for Other Services       Precautions / Restrictions Precautions Precautions: Back Precaution Booklet Issued: Yes (comment) Required Braces or Orthoses: Spinal Brace Spinal Brace: Lumbar corset;Applied in sitting position Restrictions Weight Bearing Restrictions: No      Mobility  Bed Mobility Overal bed mobility: Needs Assistance Bed Mobility: Sidelying to Sit;Rolling Rolling: Supervision Sidelying to sit: Min guard       General bed mobility comments: Bed flat, no rail, log roll technique  Transfers Overall transfer level: Needs assistance   Transfers: Sit to/from Stand Sit to Stand: Supervision         General transfer comment: cues for hand placement and pacing  Ambulation/Gait Ambulation/Gait assistance: Supervision Ambulation Distance (Feet): 150 Feet Assistive device: None Gait Pattern/deviations: WFL(Within Functional Limits) Gait velocity: ~1.0 m/s Gait velocity interpretation: at or above normal speed for age/gender General Gait Details: Mild guarding posture, good  speed  Stairs Stairs: Yes Stairs assistance: Min guard Stair Management: One rail Right;No rails Number of Stairs: 9 General stair comments: Alternating feet  Wheelchair Mobility    Modified Rankin (Stroke Patients Only)       Balance Overall balance assessment: No apparent balance deficits (not formally assessed)                                           Pertinent Vitals/Pain Pain Assessment: 0-10 Pain Score: 4  Pain Location: low back with movement Pain Descriptors / Indicators: Aching Pain Intervention(s): Monitored during session;Repositioned    Home Living Family/patient expects to be discharged to:: Private residence Living Arrangements: Spouse/significant other;Children Available Help at Discharge: Family Type of Home: House Home Access: Stairs to enter Entrance Stairs-Rails: None (Wall on one side) Entrance Stairs-Number of Steps: 3 Home Layout: One level        Prior Function Level of Independence: Independent               Hand Dominance        Extremity/Trunk Assessment   Upper Extremity Assessment: Defer to OT evaluation;Overall WFL for tasks assessed           Lower Extremity Assessment: Overall WFL for tasks assessed      Cervical / Trunk Assessment: Normal  Communication   Communication: No difficulties  Cognition Arousal/Alertness: Awake/alert Behavior During Therapy: WFL for tasks assessed/performed Overall Cognitive Status: Within Functional Limits for tasks assessed                      General Comments      Exercises  Assessment/Plan    PT Assessment All further PT needs can be met in the next venue of care  PT Diagnosis Acute pain   PT Problem List Decreased mobility;Pain  PT Treatment Interventions     PT Goals (Current goals can be found in the Care Plan section) Acute Rehab PT Goals Patient Stated Goal: Get moving again PT Goal Formulation: With patient Time For Goal  Achievement: 07/06/15 Potential to Achieve Goals: Good    Frequency     Barriers to discharge        Co-evaluation               End of Session Equipment Utilized During Treatment: Gait belt;Back brace Activity Tolerance: Patient tolerated treatment well Patient left: in chair;with chair alarm set;with family/visitor present Nurse Communication: Mobility status         Time: 1712-7871 PT Time Calculation (min) (ACUTE ONLY): 30 min   Charges:   PT Evaluation $Initial PT Evaluation Tier I: 1 Procedure PT Treatments $Therapeutic Activity: 8-22 mins   PT G CodesZenia Resides, Vantasia Pinkney L 07-20-2015, 10:51 AM

## 2015-06-22 NOTE — Evaluation (Signed)
Occupational Therapy Evaluation Patient Details Name: Dale Woods MRN: [id number] DOB: 06-21-1951 Today's Date: 06/22/2015    History of Present Illness 64 yo male post PLIF L5-S1 with connection of hardware to prior L3-L5 construct on 06/21/15.   Clinical Impression   Pt is at sup - Mod I level with ADLs and ADL mobility and will have fiance to assist him prn at home. All education completed and no further acute OT services indicated at this time    Follow Up Recommendations  No OT follow up    Equipment Recommendations  Other (comment) (ADL A/E kit)    Recommendations for Other Services       Precautions / Restrictions Precautions Precautions: Back Precaution Booklet Issued: Yes (comment) Precaution Comments: pt able torecall 3/3 back preacutions Required Braces or Orthoses: Spinal Brace Spinal Brace: Lumbar corset;Applied in sitting position Restrictions Weight Bearing Restrictions: No      Mobility Bed Mobility Overal bed mobility: Needs Assistance Bed Mobility: Sidelying to Sit;Rolling Rolling: Supervision Sidelying to sit: Min guard       General bed mobility comments: pt up in recliner, able to verbalize log roll technique  Transfers Overall transfer level: Needs assistance Equipment used: None Transfers: Sit to/from Stand Sit to Stand: Supervision         General transfer comment: cues for hand placement and pacing    Balance Overall balance assessment: No apparent balance deficits (not formally assessed)                                          ADL Overall ADL's : Needs assistance/impaired (Sup - Mod I)                                       General ADL Comments: educate don use of ADL A/E with demonstration, pt able to return demo     Vision  wears glasses at all times   Perception Perception Perception Tested?: No   Praxis Praxis Praxis tested?: Not tested    Pertinent Vitals/Pain Pain  Assessment: 0-10 Pain Score: 4  Pain Location: low back with movement Pain Descriptors / Indicators: Aching Pain Intervention(s): Monitored during session;Repositioned     Hand Dominance Left   Extremity/Trunk Assessment Upper Extremity Assessment Upper Extremity Assessment: Overall WFL for tasks assessed   Lower Extremity Assessment Lower Extremity Assessment: Defer to PT evaluation   Cervical / Trunk Assessment Cervical / Trunk Assessment: Normal   Communication Communication Communication: No difficulties   Cognition Arousal/Alertness: Awake/alert Behavior During Therapy: WFL for tasks assessed/performed Overall Cognitive Status: Within Functional Limits for tasks assessed                     General Comments   pt very pleasant and cooperative                 Home Living Family/patient expects to be discharged to:: Private residence Living Arrangements: Spouse/significant other;Children Available Help at Discharge: Family Type of Home: House Home Access: Stairs to enter Technical brewer of Steps: 3 Entrance Stairs-Rails: None Home Layout: One level     Bathroom Shower/Tub: Occupational psychologist: Handicapped height     Home Equipment: Adaptive equipment;Walker - 2 wheels Adaptive Equipment: Reacher  Prior Functioning/Environment Level of Independence: Independent             OT Diagnosis: Acute pain   OT Problem List: Decreased activity tolerance   OT Treatment/Interventions:      OT Goals(Current goals can be found in the care plan section) Acute Rehab OT Goals Patient Stated Goal: Get moving again OT Goal Formulation: With patient/family  OT Frequency:     Barriers to D/C:  none                        End of Session Equipment Utilized During Treatment: Back brace  Activity Tolerance: Patient tolerated treatment well Patient left: in chair;with call bell/phone within reach;with family/visitor  present   Time: 1610-9604 OT Time Calculation (min): 24 min Charges:  OT General Charges $OT Visit: 1 Procedure OT Evaluation $Initial OT Evaluation Tier I: 1 Procedure OT Treatments $Self Care/Home Management : 8-22 mins G-Codes:    Britt Bottom 06/22/2015, 12:44 PM

## 2015-06-22 NOTE — Progress Notes (Signed)
Foley removed, patient was given pain medications twice during the shift to control pain.

## 2015-06-23 MED ORDER — OXYCODONE-ACETAMINOPHEN 5-325 MG PO TABS: 1.0000 | ORAL_TABLET | ORAL | Status: DC | PRN

## 2015-06-23 MED ORDER — METHOCARBAMOL 500 MG PO TABS: 500.0000 mg | ORAL_TABLET | Freq: Four times a day (QID) | ORAL | Status: DC | PRN

## 2015-06-23 NOTE — Progress Notes (Signed)
Physical Therapy Treatment Patient Details Name: Dale Woods MRN: [id number] DOB: 15-Mar-1951 Today's Date: 06/23/2015    History of Present Illness 64 yo male post PLIF L5-S1 with connection of hardware to prior L3-L5 construct on 06/21/15.    PT Comments    Continues to mobilize well. No loss of balance noted with gait and stair training today. States he has been sitting up most of the morning and requests to lie back in bed for a while. Reports he will have strong family support at home. Adequate for d/c from PT standpoint.  Follow Up Recommendations  No PT follow up;Supervision/Assistance - 24 hour     Equipment Recommendations  None recommended by PT    Recommendations for Other Services       Precautions / Restrictions Precautions Precautions: Back Precaution Comments: Recalls 3/3 back precautions Required Braces or Orthoses: Spinal Brace Spinal Brace: Lumbar corset;Applied in sitting position Restrictions Weight Bearing Restrictions: No    Mobility  Bed Mobility Overal bed mobility: Modified Independent             General bed mobility comments: Demonstrates correct technique for log roll x2.  Transfers Overall transfer level: Needs assistance Equipment used: None Transfers: Sit to/from Stand Sit to Stand: Supervision         General transfer comment: Supervision for safety. Scoots to edge of bed and chair without assist. VC To maintain back precautions while rising.  Ambulation/Gait Ambulation/Gait assistance: Supervision Ambulation Distance (Feet): 200 Feet Assistive device: None Gait Pattern/deviations: Step-through pattern Gait velocity: decreased Gait velocity interpretation: Below normal speed for age/gender General Gait Details: Still with some guarding. Minimal sway noted and able to self correct. No overt loss of balance noted during bout.   Stairs   Stairs assistance: Supervision Stair Management: Alternating pattern;Step to  pattern;Forwards (holding wall on Lt) Number of Stairs: 9 General stair comments: VC for sequencing, trialed alternating and step-to approach holding wall on Lt similar to home environment. No buckling noted nor did pt need physical assist to safely complete  Wheelchair Mobility    Modified Rankin (Stroke Patients Only)       Balance                                    Cognition Arousal/Alertness: Awake/alert Behavior During Therapy: WFL for tasks assessed/performed Overall Cognitive Status: Within Functional Limits for tasks assessed                      Exercises      General Comments General comments (skin integrity, edema, etc.): Brace adjusted.      Pertinent Vitals/Pain Pain Assessment: 0-10 Pain Score: 2  Pain Location: back Pain Descriptors / Indicators: Aching Pain Intervention(s): Monitored during session;Repositioned    Home Living                      Prior Function            PT Goals (current goals can now be found in the care plan section) Acute Rehab PT Goals Patient Stated Goal: Get moving again PT Goal Formulation: With patient Time For Goal Achievement: 07/06/15 Potential to Achieve Goals: Good Progress towards PT goals: Progressing toward goals    Frequency  Min 5X/week    PT Plan Current plan remains appropriate    Co-evaluation  End of Session Equipment Utilized During Treatment: Back brace Activity Tolerance: Patient tolerated treatment well Patient left: in bed;with call bell/phone within reach     Time: 0907-0916 PT Time Calculation (min) (ACUTE ONLY): 9 min  Charges:  $Gait Training: 8-22 mins                    G Codes:      Berton Mount 06-29-2015, 9:41 AM Charlsie Merles, [place] [phone]

## 2015-06-23 NOTE — Care Management Note (Signed)
Case Management Note  Patient Details  Name: Dale Woods MRN: [id number] Date of Birth: Oct 02, 1951  Subjective/Objective:                   L5-S1 Posterior lumbar interbody fusion with exploration of L3 - L5 Fusion (N/A) - L5-S1 Posterior lumbar interbody fusion with exploration and connect to L2-L4 construct Action/Plan:  Discharge planning Expected Discharge Date:                  Expected Discharge Plan:  Home/Self Care  In-House Referral:     Discharge planning Services  CM Consult  Post Acute Care Choice:  NA Choice offered to:  NA  DME Arranged:    DME Agency:  NA  HH Arranged:  NA HH Agency:     Status of Service:  Completed, signed off  Medicare Important Message Given:    Date Medicare IM Given:    Medicare IM give by:    Date Additional Medicare IM Given:    Additional Medicare Important Message give by:     If discussed at Long Length of Stay Meetings, dates discussed:    Additional Comments: CM reviewed and not PT/OT follow up recc or ordered.  No other CM needs were communicated. Yves Dill, RN 06/23/2015, 9:17 AM

## 2015-06-23 NOTE — Progress Notes (Signed)
Discharge orders received. Pt educated on discharge instructions. Pt verbalized understanding. Pt given discharge packet and prescription. Pt taken downstairs by staff via wheelchair.

## 2015-06-23 NOTE — Discharge Summary (Signed)
Physician Discharge Summary  Patient ID: Dale Woods MRN: [id number] DOB/AGE: [birth year] 64 y.o.  Admit date: 06/21/2015 Discharge date: 06/23/2015  Admission Diagnoses:  Lumbar disc herniation, lumbar radiculopathy  Discharge Diagnoses:  Lumbar disc herniation, lumbar radiculopathy Active Problems:   Lumbar scoliosis   Discharged Condition: good  Hospital Course: Patient admitted by Dr. Venetia Maxon who performed an L5-S1 lumbar decompression and arthrodesis. Postoperatively he has done well. He is up and ambulate actively in the halls. He is asking to be discharged to home. He has been given instructions regarding wound care and activities. He is to follow-up with Dr. Venetia Maxon in about 3 weeks.  Discharge Exam: Blood pressure 144/71, pulse 96, temperature 98.4 F (36.9 C), temperature source Oral, resp. rate 18, height  (1.88 m), weight 85.821 kg (189 lb 3.2 oz), SpO2 97 %.  Disposition: 01-Home or Self Care     Medication List    STOP taking these medications        albuterol 108 (90 BASE) MCG/ACT inhaler  Commonly known as:  PROAIR HFA      TAKE these medications        acetaminophen 325 MG tablet  Commonly known as:  TYLENOL  Take 650 mg by mouth every 6 (six) hours as needed. pain     doxylamine (Sleep) 25 MG tablet  Commonly known as:  UNISOM  Take 25 mg by mouth at bedtime as needed for sleep.     ibuprofen 200 MG tablet  Commonly known as:  ADVIL,MOTRIN  Take 200 mg by mouth every 4 (four) hours as needed.     meloxicam 7.5 MG tablet  Commonly known as:  MOBIC  Take 7.5 mg by mouth 2 (two) times daily.     methocarbamol 500 MG tablet  Commonly known as:  ROBAXIN  Take 1 tablet (500 mg total) by mouth every 6 (six) hours as needed for muscle spasms.     oxyCODONE-acetaminophen 5-325 MG per tablet  Commonly known as:  PERCOCET/ROXICET  Take 1-2 tablets by mouth every 4 (four) hours as needed for moderate pain.     Tiotropium Bromide-Olodaterol 2.5-2.5  MCG/ACT Aers  Commonly known as:  STIOLTO RESPIMAT  Inhale 2 puffs into the lungs daily.     traMADol 50 MG tablet  Commonly known as:  ULTRAM  Take 50 mg by mouth every 6 (six) hours as needed.         SignedHewitt Shorts 06/23/2015, 9:41 AM

## 2015-06-26 ENCOUNTER — Encounter (HOSPITAL_COMMUNITY): Payer: Self-pay | Admitting: Neurosurgery

## 2015-07-10 ENCOUNTER — Encounter: Payer: Self-pay | Admitting: Internal Medicine

## 2015-07-10 ENCOUNTER — Ambulatory Visit: Payer: BC Managed Care – PPO | Admitting: Pulmonary Disease

## 2015-07-10 ENCOUNTER — Ambulatory Visit: Payer: BLUE CROSS/BLUE SHIELD | Admitting: Internal Medicine

## 2015-07-10 VITALS — BP 120/76 | HR 76 | Ht 74.0 in | Wt 184.0 lb

## 2015-07-10 DIAGNOSIS — J449 Chronic obstructive pulmonary disease, unspecified: Secondary | ICD-10-CM

## 2015-07-10 NOTE — Progress Notes (Signed)
Patient ID: Dale Woods, male   DOB: [birth year],   MRN: [id number]   Brief patient profile:   19 former smoker quit smoking 2008 with onset then of doe and 10-15 lb of wt gain and dx of copd by [facility] GOLD III criteria 2011   History of Present Illness  07/10/2015 1st [facility] office visit/ Dale Woods re: GOLD III Chief Complaint  Patient presents with  . Follow-up    Former Dr. Shelle Iron pt. Pt states his breathing is doing well and denies any co's today.    30 min on cycle s stopping and breathing better if warms up and MMRC 1 with steps x one flight sob Once or twice yearly bad bronchitis   No obvious day to day or daytime variability or assoc chronic cough or cp or chest tightness, subjective wheeze or overt sinus or hb symptoms. No unusual exp hx or h/o childhood pna/ asthma or knowledge of premature birth.  Sleeping ok without nocturnal  or early am exacerbation  of respiratory  c/o's or need for noct saba. Also denies any obvious fluctuation of symptoms with weather or environmental changes or other aggravating or alleviating factors except as outlined above   Current Medications, Allergies, Complete Past Medical History, Past Surgical History, Family History, and Social History were reviewed in [employer] record.  ROS  The following are not active complaints unless bolded sore throat, dysphagia, dental problems, itching, sneezing,  nasal congestion or excess/ purulent secretions, ear ache,   fever, chills, sweats, unintended wt loss, classically pleuritic or exertional cp, hemoptysis,  orthopnea pnd or leg swelling, presyncope, palpitations, abdominal pain, anorexia, nausea, vomiting, diarrhea  or change in bowel or bladder habits, change in stools or urine, dysuria,hematuria,  rash, arthralgias, visual complaints, headache, numbness, weakness or ataxia or problems with walking or coordination,  change in mood/affect or memory.      Objective:   Physical Exam    amb nad    Wt Readings from Last 3 Encounters:  07/10/15 184 lb (83.462 kg)  06/21/15 189 lb 3.2 oz (85.821 kg)  06/17/15 185 lb 13.6 oz (84.3 kg)    Vital signs reviewed    Vital signs reviewed   HEENT: nl dentition, turbinates, and orophanx. Nl external ear canals without cough reflex   NECK :  without JVD/Nodes/TM/ nl carotid upstrokes bilaterally   LUNGS: no acc muscle use, clear to A and P bilaterally without cough on insp or exp maneuvers   CV:  RRR  no s3 or murmur or increase in P2, no edema   ABD:  soft and nontender with nl excursion in the supine position. No bruits or organomegaly, bowel sounds nl  MS:  warm without deformities, calf tenderness, cyanosis or clubbing  SKIN: warm and dry without lesions    NEURO:  alert, approp, no deficits     I personally reviewed images and agree with radiology impression as follows:  CXR: 06/17/15 COPD. There is no pneumonia nor other acute cardiopulmonary abnormality.    Assessment:

## 2015-07-11 ENCOUNTER — Encounter: Payer: Self-pay | Admitting: Internal Medicine

## 2015-07-11 NOTE — Patient Instructions (Signed)
Continue stiolto 2 puffs each am   Please schedule a follow up visit in 3 months but call sooner if needed with pfts

## 2015-07-11 NOTE — Progress Notes (Signed)
 Patient ID: Dale Woods, male   DOB: Jun 11, 1951,   MRN: [id number]   Brief patient profile:  87 yowm  former smoker quit  2008 with onset then of doe and subseq 10-15 lb of wt gain and dx of copd by [facility] GOLD III criteria 2011 established with  07/10/2015    History of Present Illness  07/10/2015 1st  office visit/  re: GOLD III on stiolto 2 each am  Chief Complaint   Patient presents with   .  Follow-up     Former Dr. Shelle Iron pt. Pt states his breathing is doing well and denies any co's today.    Doe x 30 min on cycle  s stopping and breathing better if warms up and MMRC 1 with steps x one flight sob   Once or twice yearly bad bronchitis   No obvious day to day or daytime variability or assoc chronic cough or cp or chest tightness, subjective wheeze or overt sinus or hb symptoms. No unusual exp hx or h/o childhood pna/ asthma or knowledge of premature birth.   Sleeping ok without nocturnal or early am exacerbation of respiratory c/o's or need for noct saba. Also denies any obvious fluctuation of symptoms with weather or environmental changes or other aggravating or alleviating factors except as outlined above   Current Medications, Allergies, Complete Past Medical History, Past Surgical History, Family History, and Social History were reviewed in [employer] record.   ROS The following are not active complaints unless bolded  sore throat, dysphagia, dental problems, itching, sneezing, nasal congestion or excess/ purulent secretions, ear ache, fever, chills, sweats, unintended wt loss, classically pleuritic or exertional cp, hemoptysis, orthopnea pnd or leg swelling, presyncope, palpitations, abdominal pain, anorexia, nausea, vomiting, diarrhea or change in bowel or bladder habits, change in stools or urine, dysuria,hematuria, rash, arthralgias, visual complaints, headache, numbness, weakness or ataxia or problems with walking or coordination, change in  mood/affect or memory.     Objective:   Physical Exam  amb nad  Wt Readings from Last 3 Encounters:   07/10/15  184 lb (83.462 kg)   06/21/15  189 lb 3.2 oz (85.821 kg)   06/17/15  185 lb 13.6 oz (84.3 kg)    Vital signs reviewed   HEENT: nl dentition, turbinates, and orophanx. Nl external ear canals without cough reflex  NECK : without JVD/Nodes/TM/ nl carotid upstrokes bilaterally  LUNGS: no acc muscle use, clear to A and P bilaterally without cough on insp or exp maneuvers  CV: RRR no s3 or murmur or increase in P2, no edema  ABD: soft and nontender with nl excursion in the supine position - no Hoover's. No bruits or organomegaly, bowel sounds nl  MS: warm without deformities, calf tenderness, cyanosis or clubbing  SKIN: warm and dry without lesions  NEURO: alert, approp, no deficits     I personally reviewed images and agree with radiology impression as follows:  CXR: 06/17/15  COPD. There is no pneumonia nor other acute cardiopulmonary  abnormality.

## 2015-07-11 NOTE — Assessment & Plan Note (Addendum)
Quit smoking 2008 PFT's 05/2010:  FEV1 2.18 (49%), ratio 46 AAT 2011: normal   He clinically is a Group A but being maintained as a Group C on laba/lama which is fine except for what he decribes as bad bronchitis twice yearly and bmay be better off therefore on laba/ics.  Will bring him back for PFTs but for now continue stiolto 2 pffs  Daily  I had an extended discussion with the patient reviewing all relevant studies completed to date and  lasting 15 to 20 minutes of a 25 minute visit    Each maintenance medication was reviewed in detail including most importantly the difference between maintenance and prns and under what circumstances the prns are to be triggered using an action plan format that is not reflected in the computer generated alphabetically organized AVS.    Please see instructions for details which were reviewed in writing and the patient given a copy highlighting the part that I personally wrote and discussed at today's ov.

## 2015-08-20 ENCOUNTER — Other Ambulatory Visit: Payer: Self-pay | Admitting: *Deleted

## 2015-08-20 MED ORDER — TIOTROPIUM BROMIDE-OLODATEROL 2.5-2.5 MCG/ACT IN AERS: 2.0000 | INHALATION_SPRAY | Freq: Every day | RESPIRATORY_TRACT | Status: DC

## 2015-08-30 ENCOUNTER — Telehealth: Payer: Self-pay | Admitting: Internal Medicine

## 2015-08-30 MED ORDER — PREDNISONE 10 MG PO TABS: ORAL_TABLET | ORAL | Status: DC

## 2015-08-30 MED ORDER — AZITHROMYCIN 250 MG PO TABS: ORAL_TABLET | ORAL | Status: DC

## 2015-08-30 NOTE — Telephone Encounter (Signed)
Pt notified. Rx sent to pharmacy. Nothing further needed.  

## 2015-08-30 NOTE — Telephone Encounter (Signed)
zpak Prednisone 10 mg take  4 each am x 2 days,   2 each am x 2 days,  1 each am x 2 days and stop  

## 2015-08-30 NOTE — Telephone Encounter (Signed)
Spoke with pt. States that he is having a COPD flare up. Reports chest burning, SOB, dry cough and hoarseness. Onset was 4 days ago. Has been taking Stiolto 2 puffs QD. Would like MW's recommendation.  MW - please advise. Thanks.

## 2015-09-26 ENCOUNTER — Telehealth: Payer: Self-pay | Admitting: Internal Medicine

## 2015-09-26 NOTE — Telephone Encounter (Signed)
Called pt's insurance at [phone]. Pt ID # [id number] [place] approved until 09/25/2016. Pharmacy informed.  Auth # [id number]

## 2015-10-28 ENCOUNTER — Ambulatory Visit (INDEPENDENT_AMBULATORY_CARE_PROVIDER_SITE_OTHER): Payer: BLUE CROSS/BLUE SHIELD | Admitting: Internal Medicine

## 2015-10-28 DIAGNOSIS — J449 Chronic obstructive pulmonary disease, unspecified: Secondary | ICD-10-CM

## 2015-10-28 LAB — PULMONARY FUNCTION TEST
DL/VA % PRED: 72 %
DL/VA: 3.56 ml/min/mmHg/L
DLCO UNC: 25.88 ml/min/mmHg
DLCO unc % pred: 66 %
FEF 25-75 Post: 1.33 L/sec
FEF 25-75 Pre: 1.3 L/sec
FEF2575-%CHANGE-POST: 2 %
FEF2575-%Pred-Post: 39 %
FEF2575-%Pred-Pre: 38 %
FEV1-%CHANGE-POST: 1 %
FEV1-%PRED-PRE: 63 %
FEV1-%Pred-Post: 64 %
FEV1-POST: 2.7 L
FEV1-Pre: 2.65 L
FEV1FVC-%CHANGE-POST: 0 %
FEV1FVC-%Pred-Pre: 74 %
FEV6-%Change-Post: 1 %
FEV6-%PRED-PRE: 86 %
FEV6-%Pred-Post: 88 %
FEV6-PRE: 4.62 L
FEV6-Post: 4.68 L
FEV6FVC-%Change-Post: 0 %
FEV6FVC-%PRED-PRE: 100 %
FEV6FVC-%Pred-Post: 101 %
FVC-%CHANGE-POST: 0 %
FVC-%PRED-PRE: 85 %
FVC-%Pred-Post: 86 %
FVC-Post: 4.8 L
FVC-Pre: 4.76 L
POST FEV1/FVC RATIO: 56 %
POST FEV6/FVC RATIO: 98 %
PRE FEV6/FVC RATIO: 97 %
Pre FEV1/FVC ratio: 56 %
RV % pred: 129 %
RV: 3.33 L
TLC % pred: 107 %
TLC: 8.59 L

## 2015-10-28 NOTE — Progress Notes (Signed)
PFT done today. 

## 2015-10-29 ENCOUNTER — Ambulatory Visit (INDEPENDENT_AMBULATORY_CARE_PROVIDER_SITE_OTHER): Payer: BLUE CROSS/BLUE SHIELD | Admitting: Internal Medicine

## 2015-10-29 ENCOUNTER — Encounter: Payer: Self-pay | Admitting: Internal Medicine

## 2015-10-29 DIAGNOSIS — J449 Chronic obstructive pulmonary disease, unspecified: Secondary | ICD-10-CM | POA: Diagnosis not present

## 2015-10-29 MED ORDER — ALBUTEROL SULFATE HFA 108 (90 BASE) MCG/ACT IN AERS: INHALATION_SPRAY | RESPIRATORY_TRACT | Status: DC

## 2015-10-29 NOTE — Progress Notes (Signed)
Patient ID: Dale Woods, male   DOB: 1950-12-14,   MRN: [id number]   Brief patient profile:  78 yowm  former smoker quit  2008 with onset then of doe and subseq 10-15 lb of wt gain and dx of copd by [facility] GOLD III criteria 2011 established with Marcial Pless 07/10/2015 and proved to have GOLD II criteria 10/28/15    History of Present Illness  07/10/2015 1st  office visit/ Dale Woods re: GOLD III on stiolto 2 each am  Chief Complaint   Patient presents with   .  Follow-up     Former Dr. Shelle Iron pt. Pt states his breathing is doing well and denies any co's today.   Doe x 30 min on cycle  s stopping and breathing better if warms up and MMRC 1 with steps x one flight sob  Once or twice yearly bad bronchitis  rec Continue stiolto 2 puffs each am    10/29/2015  f/u ov/Dale Woods re: stiolto daily  GOLD II copd  Chief Complaint  Patient presents with  . Follow-up    PFT's done 09/28/15. Pt states his breathing is doing well. No new co's today. He would like rescue inhaler to have on hand if needed.    No obvious day to day or daytime variability or assoc chronic cough or cp or chest tightness, subjective wheeze or overt sinus or hb symptoms. No unusual exp hx or h/o childhood pna/ asthma or knowledge of premature birth.   Sleeping ok without nocturnal or early am exacerbation of respiratory c/o's or need for noct saba. Also denies any obvious fluctuation of symptoms with weather or environmental changes or other aggravating or alleviating factors except as outlined above   Current Medications, Allergies, Complete Past Medical History, Past Surgical History, Family History, and Social History were reviewed in [employer] record.   ROS The following are not active complaints unless bolded  sore throat, dysphagia, dental problems, itching, sneezing, nasal congestion or excess/ purulent secretions, ear ache, fever, chills, sweats, unintended wt loss, classically pleuritic or exertional cp,  hemoptysis, orthopnea pnd or leg swelling, presyncope, palpitations, abdominal pain, anorexia, nausea, vomiting, diarrhea or change in bowel or bladder habits, change in stools or urine, dysuria,hematuria, rash, arthralgias, visual complaints, headache, numbness, weakness or ataxia or problems with walking or coordination, change in mood/affect or memory.     Objective:   Physical Exam  amb wm nad   10/30/2015     189  07/10/15  184 lb (83.462 kg)   06/21/15  189 lb 3.2 oz (85.821 kg)   06/17/15  185 lb 13.6 oz (84.3 kg)    Vital signs reviewed   HEENT: nl dentition, turbinates, and orophanx. Nl external ear canals without cough reflex  NECK : without JVD/Nodes/TM/ nl carotid upstrokes bilaterally  LUNGS: no acc muscle use, clear to A and P bilaterally without cough on insp or exp maneuvers  CV: RRR no s3 or murmur or increase in P2, no edema  ABD: soft and nontender with nl excursion in the supine position - no Hoover's. No bruits or organomegaly, bowel sounds nl  MS: warm without deformities, calf tenderness, cyanosis or clubbing  SKIN: warm and dry without lesions  NEURO: alert, approp, no deficits     I personally reviewed images and agree with radiology impression as follows:  CXR: 06/17/15  COPD. There is no pneumonia nor other acute cardiopulmonary  abnormality.   Outpatient Encounter Prescriptions as of 10/29/2015  Medication Sig  .  acetaminophen (TYLENOL) 325 MG tablet Take 650 mg by mouth every 6 (six) hours as needed. pain  . doxylamine, Sleep, (UNISOM) 25 MG tablet Take 25 mg by mouth at bedtime as needed for sleep.  Marland Kitchen. ibuprofen (ADVIL,MOTRIN) 200 MG tablet Take 200 mg by mouth every 4 (four) hours as needed.  . Tiotropium Bromide-Olodaterol (STIOLTO RESPIMAT) 2.5-2.5 MCG/ACT AERS Inhale 2 puffs into the lungs daily.  . traMADol (ULTRAM) 50 MG tablet Take 50 mg by mouth every 6 (six) hours as needed.  Marland Kitchen. albuterol (PROAIR HFA) 108 (90 BASE) MCG/ACT inhaler 2 puffs every  4 hours as needed only  if your can't catch your breath  . [DISCONTINUED] azithromycin (ZITHROMAX) 250 MG tablet Use as directed.  . [DISCONTINUED] meloxicam (MOBIC) 7.5 MG tablet Take 7.5 mg by mouth 2 (two) times daily.  . [DISCONTINUED] predniSONE (DELTASONE) 10 MG tablet Take 4 tablets with food x 2 days, 2 tabs x 2 days, 1 tab x 2 days, then STOP   No facility-administered encounter medications on file as of 10/29/2015.

## 2015-10-29 NOTE — Patient Instructions (Signed)
Continue stiolto 2 puffs each am  Only use your albuterol as a rescue medication to be used if you can't catch your breath by resting or doing a relaxed purse lip breathing pattern.  - The less you use it, the better it will work when you need it. - Ok to use up to 2 puffs  every 4 hours if you must but call for immediate appointment if use goes up over your usual need - Don't leave home without it !!  (think of it like the spare tire for your car)    If you are satisfied with your treatment plan,  let your doctor know and he/she can either refill your medications or you can return here when your prescription runs out.     If in any way you are not 100% satisfied,  please tell us.  If 100% better, tell your friends!  Pulmonary follow up is as needed

## 2015-10-30 ENCOUNTER — Encounter: Payer: Self-pay | Admitting: Internal Medicine

## 2015-10-30 NOTE — Assessment & Plan Note (Addendum)
Quit smoking 2008 PFT's 05/2010:  FEV1 2.18 (49%), ratio 46 AAT 2011: normal - PFT's  10/28/2015  FEV1 2.70 (64 % ) ratio 54  p no % improvement from saba with DLCO  66 % corrects to 72 % for alv volume  p am stiolto  I had an extended final summary discussion with the patient reviewing all relevant studies completed to date and  lasting 15 to 20 minutes of a 25 minute visit on the following issues:    Dale Woods only has moderate, not severe disease and should do well as long as Dale Woods stays on the LABA/LAMA combination and refrains from smoking - if Dale Woods starts having worse ex tol or exac of copd then would need laba/lama/ics next but I don't think that will be needed.  Dale Woods would benefit from having saba rescue/ reviewed approp  use.  - The proper method of use, as well as anticipated side effects, of a metered-dose inhaler are discussed and demonstrated to the patient. Improved effectiveness after extensive coaching during this visit to a level of approximately 90 % from a baseline of 90 %   Each maintenance medication was reviewed in detail including most importantly the difference between maintenance and as needed and under what circumstances the prns are to be used.  Please see instructions for details which were reviewed in writing and the patient given a copy.    Pulmonary f/u can be prn

## 2016-03-20 ENCOUNTER — Other Ambulatory Visit: Payer: Self-pay | Admitting: Internal Medicine

## 2016-04-21 ENCOUNTER — Other Ambulatory Visit: Payer: Self-pay | Admitting: Internal Medicine

## 2016-05-17 ENCOUNTER — Other Ambulatory Visit: Payer: Self-pay | Admitting: Internal Medicine

## 2016-06-14 ENCOUNTER — Other Ambulatory Visit: Payer: Self-pay | Admitting: Internal Medicine

## 2016-07-13 IMAGING — RF DG C-ARM 61-120 MIN
1 series · 2 of 2 positions shown · non-contrast
Comparison: Lumbar spine MRI 04/11/2015

CLINICAL DATA: Extension of previous lumbar fusion for L5-S1 fusion

EXAM:
DG C-ARM 61-120 MIN; LUMBAR SPINE - 2-3 VIEW

[Series 1: run · 2 of 2 slices shown]
[im 1/2]
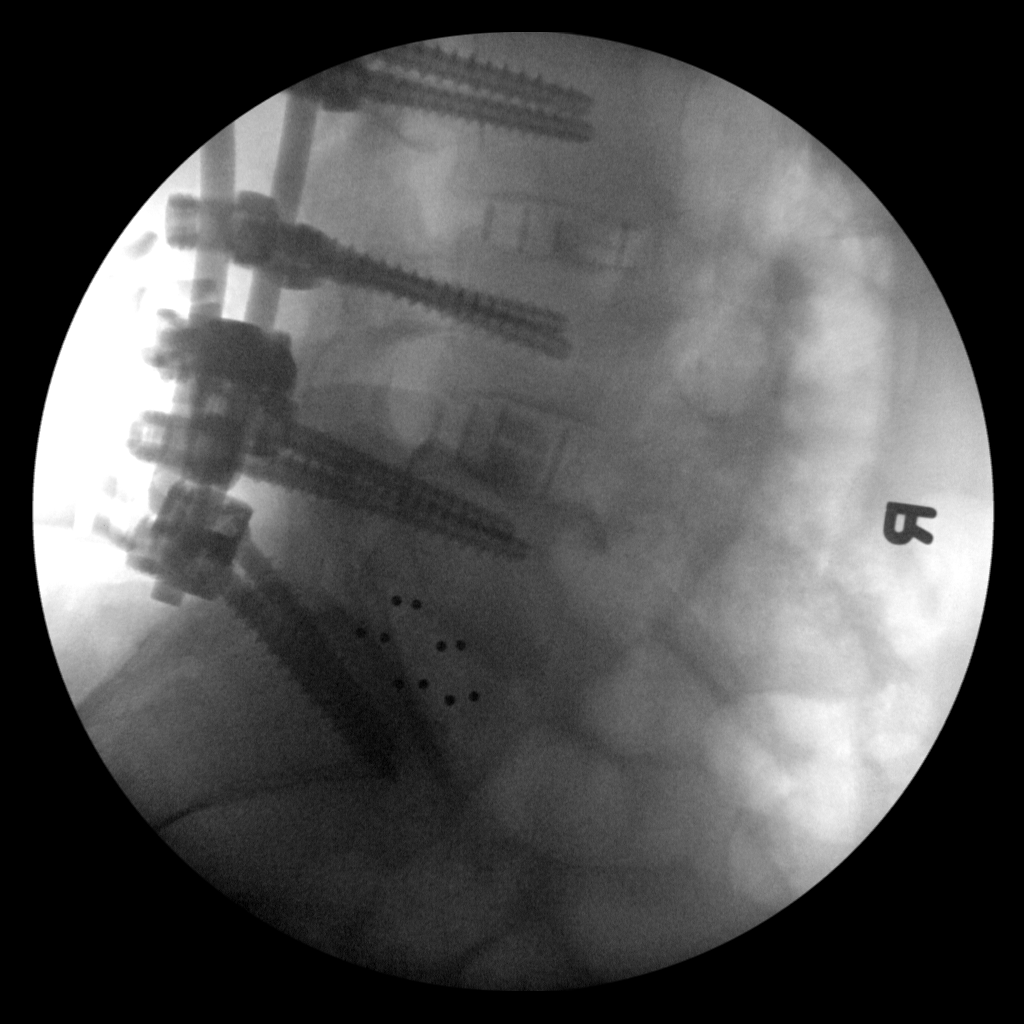
[im 2/2]
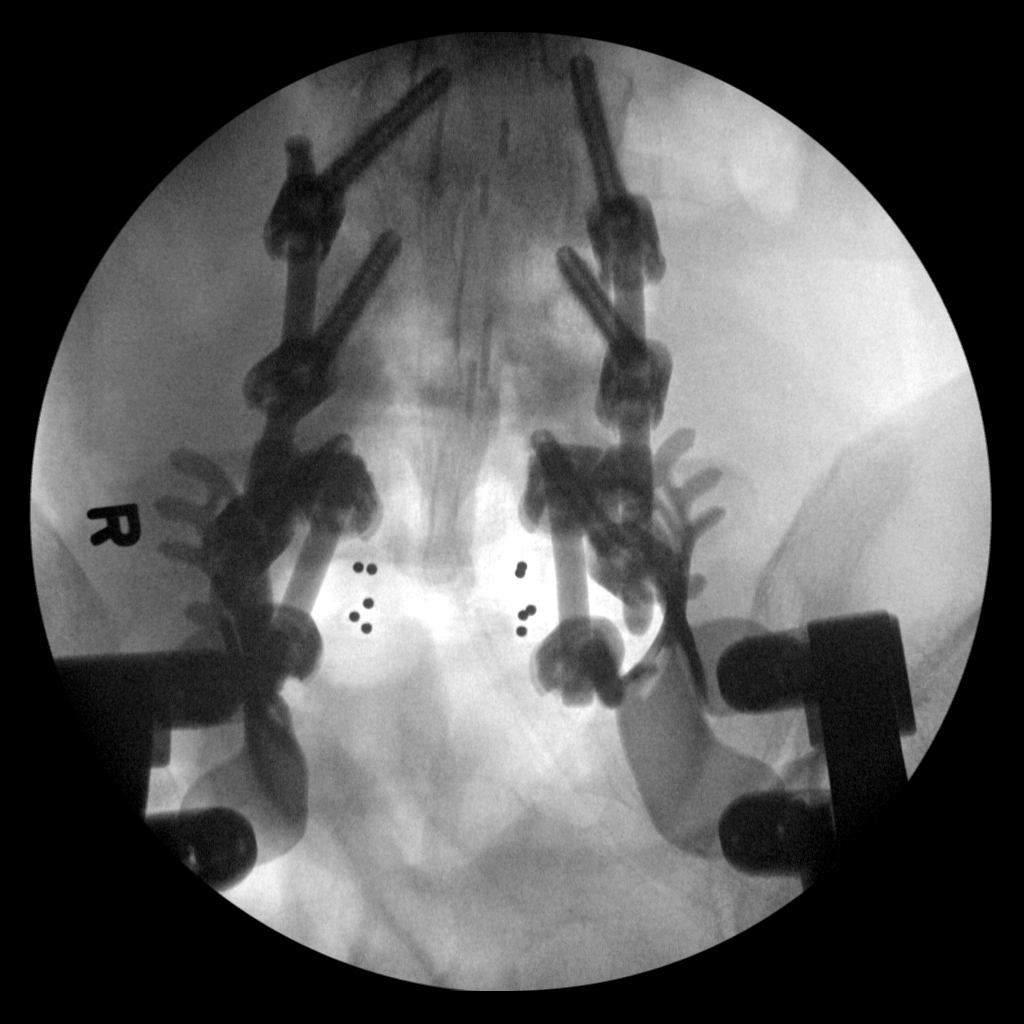

[2 of 2 positions shown; findings below may reference images not displayed]

FINDINGS: Extension of the previously seen L3-L5 fusion to now include fusion
across the L5-S1 disc space posteriorly. No hardware or bony
complicating feature.
IMPRESSION: Posterior fusion L5-S1.

## 2016-07-13 IMAGING — CR DG LUMBAR SPINE 1V
1 series · 1 of 1 positions shown · non-contrast
Comparison: 04/11/2015 MRI

CLINICAL DATA: L5-S1 posterior fusion

EXAM:
LUMBAR SPINE - 1 VIEW

[lat]
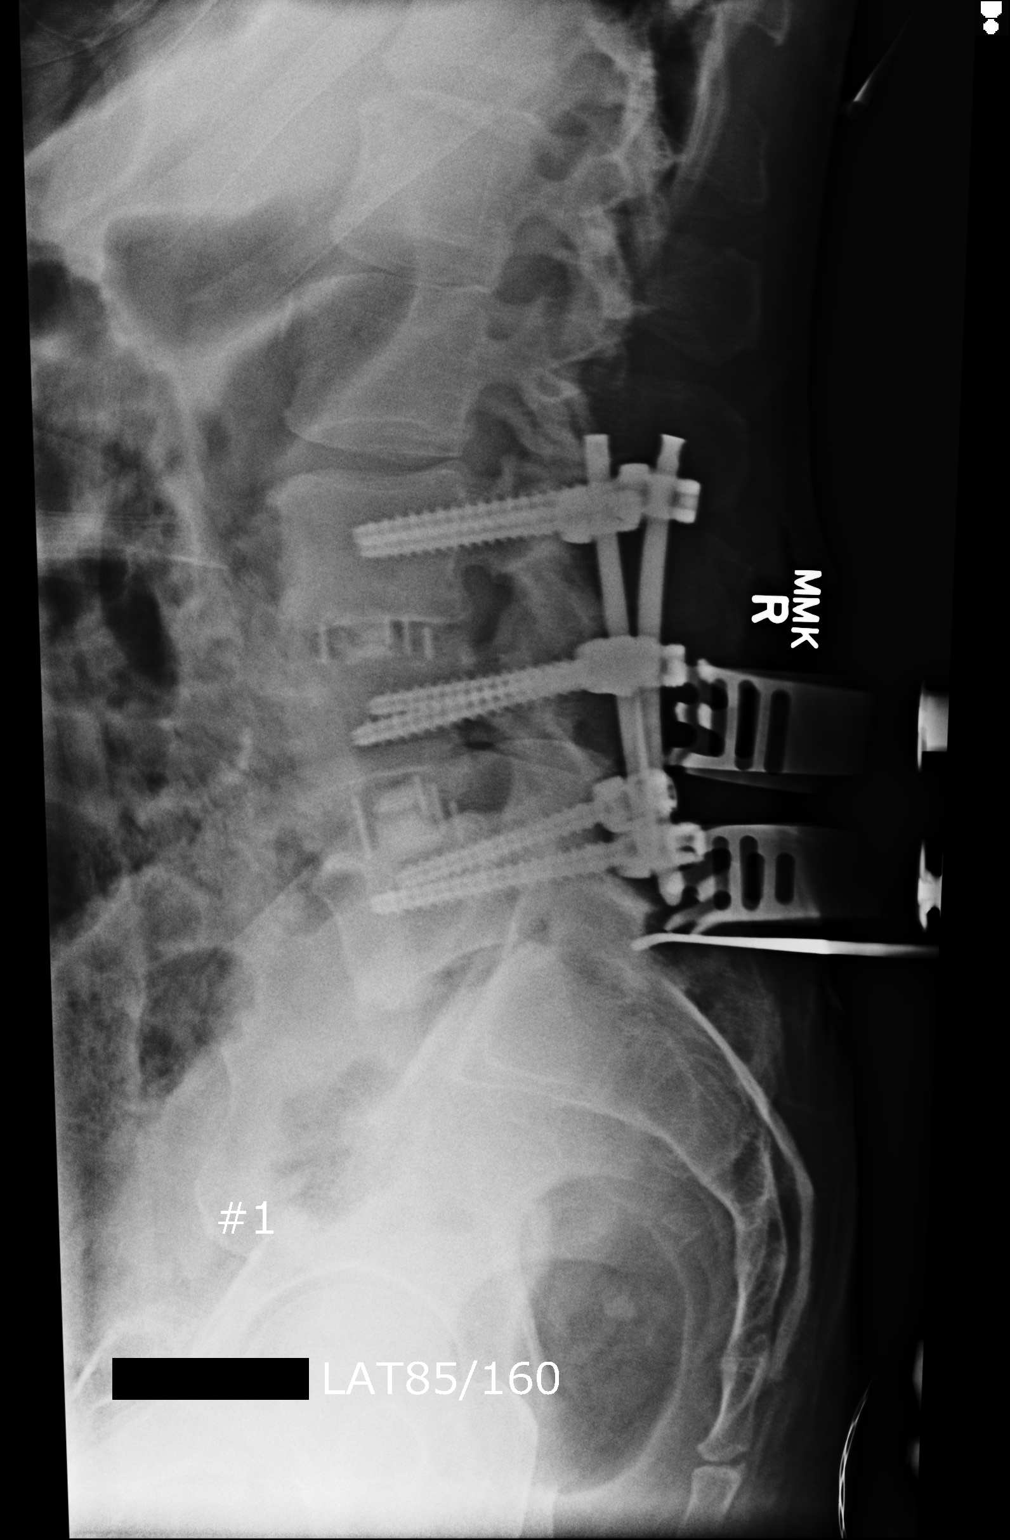

[1 of 1 positions shown; findings below may reference images not displayed]

FINDINGS: Single cross-table lateral view of the lumbar spine demonstrates
posterior localizing instrument directed at the L5-S1 level. Prior
posterior fusion changes from L3-L5.
IMPRESSION: Intraoperative localization of L5-S1.

## 2016-11-12 ENCOUNTER — Other Ambulatory Visit: Payer: Self-pay | Admitting: Internal Medicine

## 2016-11-12 DIAGNOSIS — J449 Chronic obstructive pulmonary disease, unspecified: Secondary | ICD-10-CM

## 2017-07-07 ENCOUNTER — Other Ambulatory Visit: Payer: Self-pay | Admitting: Internal Medicine

## 2017-07-07 DIAGNOSIS — J449 Chronic obstructive pulmonary disease, unspecified: Secondary | ICD-10-CM

## 2017-09-28 ENCOUNTER — Other Ambulatory Visit: Payer: Self-pay | Admitting: Nurse Practitioner

## 2017-09-28 DIAGNOSIS — Z87891 Personal history of nicotine dependence: Secondary | ICD-10-CM

## 2017-10-06 ENCOUNTER — Ambulatory Visit: Payer: BLUE CROSS/BLUE SHIELD

## 2017-11-10 ENCOUNTER — Ambulatory Visit
Admission: RE | Admit: 2017-11-10 | Discharge: 2017-11-10 | Disposition: A | Discharge status: Home / Self Care | Payer: BLUE CROSS/BLUE SHIELD | Source: Ambulatory Visit | Attending: Nurse Practitioner | Admitting: Nurse Practitioner

## 2017-11-10 DIAGNOSIS — Z87891 Personal history of nicotine dependence: Secondary | ICD-10-CM

## 2018-05-23 ENCOUNTER — Ambulatory Visit: Payer: Self-pay | Admitting: Nurse Practitioner

## 2018-05-23 VITALS — BP 110/75 | HR 63 | Temp 98.6°F | Resp 16 | Wt 183.2 lb

## 2018-05-23 DIAGNOSIS — W57XXXA Bitten or stung by nonvenomous insect and other nonvenomous arthropods, initial encounter: Secondary | ICD-10-CM

## 2018-05-23 DIAGNOSIS — S80261A Insect bite (nonvenomous), right knee, initial encounter: Secondary | ICD-10-CM

## 2018-05-23 NOTE — Progress Notes (Signed)
Subjective:  Dale Woods is a 67 y.o. male who presents for evaluation of an insect bite to his right knee.  Patient states he does not know what bit him.  Patient states he started itching at the site and noticed the bite at that time.   The patient states this morning when he woke up, there was an area on his knee that looked like a "pimple".  Patient states he did expel the drainage that was incised, but denies foul-smelling drainage or a large amount of drainage.  Patient further complains of itching, redness, and swelling to the site.  The patient denies fever, chills, cold sweats and warmth. The patient has not taken anything for his symptoms..  Review of Systems  Constitutional: Negative.   HENT: Negative.   Respiratory: Negative.   Cardiovascular: Negative.   Skin:       Redness, itching and swelling to right knee  Neurological: Negative.   Psychiatric/Behavioral: Negative.     Objective:  Physical Exam  Constitutional: He is oriented to person, place, and time. He appears well-developed and well-nourished. No distress.  HENT:  Head: Normocephalic and atraumatic.  Right Ear: External ear normal.  Left Ear: External ear normal.  Mouth/Throat: Oropharynx is clear and moist.  Eyes: Pupils are equal, round, and reactive to light. EOM are normal.  Neck: Normal range of motion. Neck supple.  Cardiovascular: Normal rate, regular rhythm and normal heart sounds.  Pulmonary/Chest: Effort normal and breath sounds normal.  Musculoskeletal: Normal range of motion.  Neurological: He is alert and oriented to person, place, and time.  Skin:  Minimal erythema to right knee, approx 3mm area of induration from which drainage was expelled. Mild swelling to right knee.      Assessment:  an unknown insect/Insect Bite    Plan:  Exam findings, diagnosis etiology and medication use and indications reviewed with patient. Follow- Up and discharge instructions provided. No emergent/urgent  issues found on exam.  Patient verbalized understanding of information provided and agrees with plan of care (POC), all questions answered.  Insect Bite/Unknown Insect -Benadryl 25mg  (OTC) at bedtime until symptoms improve. -Ceterizine 10mg  (OTC) each morning until symptoms improve. -Ibuprofen or Tylenol for pain. -Follow up if increased redness, foul-smelling drainage, weakness, fever or chills develop.

## 2018-05-23 NOTE — Patient Instructions (Signed)
Insect Bite, Adult   Insect Bite/Unknown Insect -Benadryl 25mg  at bedtime until symptoms improve. -Ceterizine 10mg  each morning until symptoms improve. -Ibuprofen or Tylenol for pain. -Follow up if increased redness, foul-smelling drainage, weakness, fever or chills develop.  An insect bite can make your skin red, itchy, and swollen. An insect bite is different from an insect sting, which happens when an insect injects poison (venom) into the skin. Some insects can spread disease to people through a bite. However, most insect bites do not lead to disease and are not serious. What are the causes? Insects may bite for a variety of reasons, including:  Hunger.  To defend themselves.  Insects that bite include:  Spiders.  Mosquitoes.  Ticks.  Fleas.  Ants.  Flies.  Bedbugs.  What are the signs or symptoms? Symptoms of this condition include:  Itching or pain in the bite area.  Redness and swelling in the bite area.  An open wound (skin ulcer).  In many cases, symptoms last for 2-4 days. How is this diagnosed? This condition is usually diagnosed based on symptoms and a physical exam. How is this treated? Treatment is usually not needed. Symptoms often go away on their own. When treatment is recommended, it may involve:  Applying a cream or lotion to the bitten area. This treatment helps with itching.  Taking an antibiotic medicine. This treatment is needed if the bite area gets infected.  Getting a tetanus shot.  Applying ice to the affected area.  Medicines called antihistamines. This treatment is needed if you develop an allergic reaction to the insect bite.  Follow these instructions at home: Bite area care  Do not scratch the bite area.  Keep the bite area clean and dry. Wash it every day with soap and water as told by your health care provider.  Check the bite area every day for signs of infection. Check for: ? More redness, swelling, or  pain. ? Fluid or blood. ? Warmth. ? Pus. Managing pain, itching, and swelling   You may apply a baking soda paste, cortisone cream, or calamine lotion to the bite area as told by your health care provider.  If directed, applyice to the bite area. ? Put ice in a plastic bag. ? Place a towel between your skin and the bag. ? Leave the ice on for 20 minutes, 2-3 times per day. Medicines  Apply or take over-the-counter and prescription medicines only as told by your health care provider.  If you were prescribed an antibiotic medicine, use it as told by your health care provider. Do not stop using the antibiotic even if your condition improves. General instructions  Keep all follow-up visits as told by your health care provider. This is important. How is this prevented? To help reduce your risk of insect bites:  When you are outdoors, wear clothing that covers your arms and legs.  Use insect repellent. The best insect repellents contain: ? DEET, picaridin, oil of lemon eucalyptus (OLE), or IR3535. ? Higher amounts of an active ingredient.  If your home windows do not have screens, consider installing them.  Contact a health care provider if:  You have more redness, swelling, or pain in the bite area.  You have fluid, blood, or pus coming from the bite area.  The bite area feels warm to the touch.  You have a fever. Get help right away if:  You have joint pain.  You have a rash.  You have shortness of breath.  You feel unusually tired or sleepy.  You have neck pain.  You have a headache.  You have unusual weakness.  You have chest pain.  You have nausea, vomiting, or pain in the abdomen. This information is not intended to replace advice given to you by your health care provider. Make sure you discuss any questions you have with your health care provider. Document Released: 12/10/2004 Document Revised: 07/01/2016 Document Reviewed: 05/11/2016 Elsevier  Interactive Patient Education  [employer].

## 2019-11-20 ENCOUNTER — Ambulatory Visit: Payer: Medicare Other | Attending: Internal Medicine

## 2019-11-20 DIAGNOSIS — U071 COVID-19: Secondary | ICD-10-CM

## 2019-11-21 LAB — NOVEL CORONAVIRUS, NAA: SARS-CoV-2, NAA: NOT DETECTED

## 2019-12-05 ENCOUNTER — Ambulatory Visit: Payer: Medicare Other | Attending: Internal Medicine

## 2019-12-05 DIAGNOSIS — Z23 Encounter for immunization: Secondary | ICD-10-CM | POA: Insufficient documentation

## 2019-12-05 NOTE — Progress Notes (Signed)
   Covid-19 Vaccination Clinic  Name:  Dale Woods    MRN: [id number] DOB: [birth year]  12/05/2019  Dale Woods was observed post Covid-19 immunization for 15 minutes without incidence. He was provided with Vaccine Information Sheet and instruction to access the V-Safe system.   Dale Woods was instructed to call 911 with any severe reactions post vaccine: Marland Kitchen Difficulty breathing  . Swelling of your face and throat  . A fast heartbeat  . A bad rash all over your body  . Dizziness and weakness    Immunizations Administered    Name Date Dose VIS Date Route   Pfizer COVID-19 Vaccine 12/05/2019  6:14 PM 0.3 mL 10/27/2019 Intramuscular   Manufacturer: [employer], [employer]   Lot: [id number]   NDC: [address]-7

## 2019-12-26 ENCOUNTER — Ambulatory Visit: Payer: Medicare Other | Attending: Internal Medicine

## 2019-12-26 DIAGNOSIS — Z23 Encounter for immunization: Secondary | ICD-10-CM | POA: Insufficient documentation

## 2019-12-26 NOTE — Progress Notes (Signed)
   Covid-19 Vaccination Clinic  Name:  Dale Woods    MRN: [id number] DOB: 11-26-1950  12/26/2019  Dale Woods was observed post Covid-19 immunization for 15 minutes without incidence. He was provided with Vaccine Information Sheet and instruction to access the V-Safe system.   Dale Woods was instructed to call 911 with any severe reactions post vaccine: Marland Kitchen Difficulty breathing  . Swelling of your face and throat  . A fast heartbeat  . A bad rash all over your body  . Dizziness and weakness    Immunizations Administered    Name Date Dose VIS Date Route   Pfizer COVID-19 Vaccine 12/26/2019 10:14 AM 0.3 mL 10/27/2019 Intramuscular   Manufacturer: [employer], [employer]   Lot: [id number]   NDC: [address]-2

## 2019-12-27 ENCOUNTER — Ambulatory Visit: Payer: Medicare Other

## 2023-03-01 ENCOUNTER — Encounter: Payer: Self-pay | Admitting: Internal Medicine

## 2023-03-01 ENCOUNTER — Ambulatory Visit: Payer: Medicare Other | Admitting: Internal Medicine

## 2023-03-01 VITALS — BP 132/74 | HR 77 | Temp 98.1°F | Ht 74.0 in | Wt 178.8 lb

## 2023-03-01 DIAGNOSIS — J4489 Other specified chronic obstructive pulmonary disease: Secondary | ICD-10-CM

## 2023-03-01 DIAGNOSIS — J439 Emphysema, unspecified: Secondary | ICD-10-CM | POA: Diagnosis not present

## 2023-03-01 MED ORDER — FLUTICASONE PROPIONATE 50 MCG/ACT NA SUSP: 2.0000 | Freq: Every day | NASAL | 2 refills | Status: DC

## 2023-03-01 MED ORDER — AEROCHAMBER MV MISC: 0 refills | Status: AC

## 2023-03-01 MED ORDER — BEVESPI AEROSPHERE 9-4.8 MCG/ACT IN AERO: 2.0000 | INHALATION_SPRAY | Freq: Two times a day (BID) | RESPIRATORY_TRACT | 5 refills | Status: AC

## 2023-03-01 NOTE — Patient Instructions (Addendum)
Please schedule follow up scheduled with myself in 3 months.  If my schedule is not open yet, we will contact you with a reminder closer to that time. Please call [phone] if you haven't heard from [place] a month before.   PFTs before next visit  Stop Stiolto inhaler when you're finished with the doses you have.   Start bevespi inhaler 2 puffs twice a day with spacer.   Continue albuterol inhaler as needed.  Flonase - 1 spray on each side of your nose twice a day for first week, then 1 spray on each side.   Instructions for use: If you also use a saline nasal spray or rinse, use that first. Position the head with the chin slightly tucked. Use the right hand to spray into the left nostril and the right hand to spray into the left nostril.   Point the bottle away from the septum of your nose (cartilage that divides the two sides of your nose).  Hold the nostril closed on the opposite side from where you will spray Spray once and gently sniff to pull the medicine into the higher parts of your nose.  Don't sniff too hard as the medicine will drain down the back of your throat instead. Repeat with a second spray on the same side if prescribed. Repeat on the other side of your nose.

## 2023-03-01 NOTE — Progress Notes (Signed)
[name]    [id number]    February 22, 1951  Primary Care Physician:Badger, Kayleen Memos, MD  Referring Physician: Eartha Inch, MD [address] [place],  [place] [address] Reason for Consultation: copd Date of Consultation: 03/01/2023  Chief complaint:   Chief Complaint  Patient presents with   Consult    Reestablishing      HPI: Dale Woods is a 72 y.o. man with past medical history of COPD. Previously followed by our practice but not seen since 2016. Here to re-establish. Diagnosed 2014. Currently on stiolto. Takes albuterol almost never.   This year since January he has had multiple courses of prednisone/abx 3-4 times. This is is atypical for him. Usually it happens about once a year so this was atypical. Now he is having trouble with his voice quality - it feels raspy for 3-4 years and this bothers him.   Independent with ADLs, no limitations. Sometimes he gets winded and has to slow down.   He does have seasonal allergic rhinitis. Has been taking afrin and is now suffering from rebound congestion.  Takes doxylamine at night for sleep. He is also taking levocetirizine which is helping. Having some dry mouth.   Feels allergies might have triggered his breathing this past season.   Social history:  Occupation: retired, worked as a Veterinary surgeon.  Exposures: lives at home with partner Smoking history: quit 2008  Social History   Occupational History   Occupation: Veterinary surgeon  Tobacco Use   Smoking status: Former    Packs/day: 2.00    Years: 37.00    Additional pack years: 0.00    Total pack years: 74.00    Types: Cigarettes    Quit date: 06/17/2007    Years since quitting: 15.7   Smokeless tobacco: Not on file  Substance and Sexual Activity   Alcohol use: No    Alcohol/week: 0.0 standard drinks of alcohol   Drug use: No   Sexual activity: Not on file    Relevant family history:  Family History  Problem Relation Age of Onset    Allergies Mother    Colon cancer Mother    Emphysema Father    Lung cancer Father    Cancer - Lung Father    Lung cancer Brother     Past Medical History:  Diagnosis Date   Allergic rhinitis    Anxiety    COPD (chronic obstructive pulmonary disease)    Family history of adverse reaction to anesthesia    Mother- "crash wiwth anesthesia"- high doeses   GERD (gastroesophageal reflux disease)    Shortness of breath dyspnea     Past Surgical History:  Procedure Laterality Date   ANTERIOR LAT LUMBAR FUSION  06/28/2012   Procedure: ANTERIOR LATERAL LUMBAR FUSION 2 LEVELS;  Surgeon: Maeola Harman, MD;  Location: [facility];  Service: Neurosurgery;  Laterality: Right;  Right sided approach, Lumbar three-four, lumbar four-five Anterolateral decompression/fusion/Percutaneous pedicle screws Lumbar three-lumbar five   KNEE SURGERY Right 1980   open incision to remove bone fragments   TONSILLECTOMY      Physical Exam: Blood pressure 132/74, pulse 77, temperature 98.1 F (36.7 C), temperature source Oral, height 6\' 2"  (1.88 m), weight 178 lb 12.8 oz (81.1 kg), SpO2 96 %. Gen:      No acute distress ENT:  no nasal polyps, mucus membranes moist Lungs:    No increased respiratory effort, symmetric chest wall excursion, clear to auscultation  bilaterally, no wheezes or crackles CV:         Regular rate and rhythm; no murmurs, rubs, or gallops.  No pedal edema Abd:      + bowel sounds; soft, non-tender; no distension MSK: no acute synovitis of DIP or PIP joints, no mechanics hands.  Skin:      Warm and dry; no rashes Neuro: normal speech, no focal facial asymmetry Psych: alert and oriented x3, normal mood and affect   Data Reviewed/Medical Decision Making:  Independent interpretation of tests: Imaging:  Review of patient's LDCT lung cancer screenin 2018 images revealed mild emphysema, no suspicious nodules. The patient's images have been independently reviewed by me.   LDCT lung cancer  screening report reviewed in 2004 - no suspicious nodules  PFTs: I have personally reviewed the patient's PFTs and moderately severe airflow limitation.     Latest Ref Rng & Units 10/28/2015    3:59 PM  PFT Results  FVC-Pre L 4.76   FVC-Predicted Pre % 85   FVC-Post L 4.80   FVC-Predicted Post % 86   Pre FEV1/FVC % % 56   Post FEV1/FCV % % 56   FEV1-Pre L 2.65   FEV1-Predicted Pre % 63   FEV1-Post L 2.70   DLCO uncorrected ml/min/mmHg 25.88   DLCO UNC% % 66   DLVA Predicted % 72   TLC L 8.59   TLC % Predicted % 107   RV % Predicted % 129     Labs:  Lab Results  Component Value Date   WBC 5.8 06/17/2015   HGB 15.4 06/17/2015   HCT 45.2 06/17/2015   MCV 92.8 06/17/2015   PLT 189 06/17/2015   Lab Results  Component Value Date   NA 139 06/17/2015   K 4.3 06/17/2015   CL 104 06/17/2015   CO2 28 06/17/2015     Immunization status:  Immunization History  Administered Date(s) Administered   Covid-19, Mrna,Vaccine(Spikevax)64yrs and older 10/05/2022   Fluad Quad(high Dose 65+) 08/23/2018   Influenza Split 09/23/2011, 09/16/2013, 08/16/2014, 08/17/2015   Influenza Whole 08/17/2010, 08/16/2012   Influenza,inj,Quad PF,6+ Mos 09/02/2017   PFIZER(Purple Top)SARS-COV-2 Vaccination 12/05/2019, 12/26/2019, 08/09/2020   Pfizer Covid-19 Vaccine Bivalent Booster 94yrs & up 08/27/2021   Pneumococcal Conjugate-13 08/23/2018   Pneumococcal Polysaccharide-23 08/17/2010, 11/16/2010   Tdap 10/06/2013   Zoster, Live 06/23/2016     I reviewed prior external note(s) from pulmonary  I reviewed the result(s) of the labs and imaging as noted above.   I have ordered PFT   Assessment:  COPD with progressive symptoms, FEV1 56% in 2016.  Allergic rhinitis with component of rhinitis medicamentosa Voice hoarseness  Plan/Recommendations:  PFTs before next visit  Stop Stiolto inhaler when you're finished with the doses you have.  Suspect voice hoarseness is medication side  effect. Start bevespi inhaler 2 puffs twice a day with spacer.   Continue albuterol inhaler as needed.  Stop afrin and pseduoephedrine  Flonase - 1 spray on each side of your nose twice a day for first week, then 1 spray on each side.  Continue xyzal. Counseled on anti-cholinergic side effects with doxylamine  Quit smoking in 2008 so probably outside window for lung cancer screening now.this is done through pcp at [facility].   Return to Care: Return in about 3 months (around 05/31/2023) for PFT before next visit. Durel Salts, MD Pulmonary and Critical Care Medicine [facility] Office:[phone]  CC: Eartha Inch, MD

## 2023-03-08 ENCOUNTER — Encounter: Payer: Self-pay | Admitting: Internal Medicine

## 2023-03-08 ENCOUNTER — Other Ambulatory Visit: Payer: Self-pay | Admitting: Nurse Practitioner

## 2023-03-08 DIAGNOSIS — J4489 Other specified chronic obstructive pulmonary disease: Secondary | ICD-10-CM

## 2023-03-08 DIAGNOSIS — J310 Chronic rhinitis: Secondary | ICD-10-CM

## 2023-03-08 DIAGNOSIS — R058 Other specified cough: Secondary | ICD-10-CM

## 2023-03-08 MED ORDER — PREDNISONE 20 MG PO TABS: 40.0000 mg | ORAL_TABLET | Freq: Every day | ORAL | 0 refills | Status: AC

## 2023-03-08 MED ORDER — BENZONATATE 200 MG PO CAPS: 200.0000 mg | ORAL_CAPSULE | Freq: Three times a day (TID) | ORAL | 1 refills | Status: AC | PRN

## 2023-03-08 MED ORDER — BENZONATATE 200 MG PO CAPS: 200.0000 mg | ORAL_CAPSULE | Freq: Three times a day (TID) | ORAL | 1 refills | Status: DC | PRN

## 2023-03-08 MED ORDER — PREDNISONE 20 MG PO TABS: 40.0000 mg | ORAL_TABLET | Freq: Every day | ORAL | 0 refills | Status: DC

## 2023-03-08 NOTE — Telephone Encounter (Signed)
I read in the note that he had been using Afrin nasal spray on a consistent basis. Stopping this after long term use is likely the cause of his rebound nasal symptoms as our nose becomes dependent on this with prolonged use and you end up with significant inflammation in the nasal passages. Recommend no further use. He should keep using the flonase 2 sprays each nostril daily. Can increase to twice a day for a week or so, if tolerated. Do saline rinses 1-2 times a day, about 20-30 min before the flonase. This will help calm down the inflammation. It can take around 2 weeks or so for this to improve.  This is likely where his cough is coming from. Let's try the above measures, a short course of steroids and cough control measures to see if this helps before changing his inhaler again. I have sent rx for prednisone 40 mg daily x 5 days and benzonatate 1 capsule tid to the pharmacy. Needs OV if no improvement. Thanks.

## 2023-03-08 NOTE — Telephone Encounter (Signed)
Called and spoke with patient due to nature of Mychart message. He stated that he had some chest congestion during his OV with Dr. Celine Mans on 03/01/23 but it was not alarming. He switched to [facility] after this visit and his symptoms have gotten worse. He has a productive cough now but has not paid any attention to the color. He also has some nasal draining that is clear in color. He denied any fever or body aches but has developed a headache that is right in between his eyes.   Confirmed he was using the Bevespi 2 puffs twice daily. He is also still using the Flonase but does not feel like it is working for him as good as it has in the past. He took a covid test yesterday and today and both tests were negative. Denied being around anyone who has been sick recently.   [facility]   Florentina Addison, can you please advise since Dr. Celine Mans is off today? Thanks!

## 2023-03-08 NOTE — Telephone Encounter (Signed)
Called and spoke with patient. He confirmed that he is not using the Afrin nasal spray anymore. He verbalized understanding of recommendations. I will cancel the RXs that were sent to [facility] and send these to [facility].   Nothing further needed at time of call.

## 2023-10-20 ENCOUNTER — Other Ambulatory Visit: Payer: Self-pay | Admitting: Family Medicine

## 2023-10-20 DIAGNOSIS — F17200 Nicotine dependence, unspecified, uncomplicated: Secondary | ICD-10-CM

## 2023-10-20 DIAGNOSIS — I739 Peripheral vascular disease, unspecified: Secondary | ICD-10-CM

## 2023-10-26 ENCOUNTER — Other Ambulatory Visit: Payer: Medicare Other

## 2023-11-01 ENCOUNTER — Other Ambulatory Visit: Payer: Medicare Other

## 2023-11-17 ENCOUNTER — Other Ambulatory Visit: Payer: Self-pay | Admitting: Internal Medicine

## 2023-11-22 ENCOUNTER — Other Ambulatory Visit: Payer: Medicare Other

## 2023-11-22 NOTE — Telephone Encounter (Signed)
 Pt needs a follow up by 02-29-2024 for future refills.

## 2023-11-24 ENCOUNTER — Ambulatory Visit
Admission: RE | Admit: 2023-11-24 | Discharge: 2023-11-24 | Disposition: A | Discharge status: Home / Self Care | Payer: Medicare Other | Source: Ambulatory Visit | Attending: Family Medicine | Admitting: Family Medicine

## 2023-11-24 DIAGNOSIS — F17200 Nicotine dependence, unspecified, uncomplicated: Secondary | ICD-10-CM

## 2023-11-24 DIAGNOSIS — I739 Peripheral vascular disease, unspecified: Secondary | ICD-10-CM
